# Patient Record
Sex: Male | Born: 1980 | Race: White | Hispanic: No | Marital: Single | Smoking: Current every day smoker
Health system: Southern US, Community
[De-identification: ages and names within clinical notes are randomized; demographics above are authoritative.]

## PROBLEM LIST (undated history)

## (undated) DIAGNOSIS — G8929 Other chronic pain: Secondary | ICD-10-CM

## (undated) DIAGNOSIS — F419 Anxiety disorder, unspecified: Secondary | ICD-10-CM

## (undated) DIAGNOSIS — M549 Dorsalgia, unspecified: Secondary | ICD-10-CM

## (undated) DIAGNOSIS — J45909 Unspecified asthma, uncomplicated: Secondary | ICD-10-CM

---

## 2006-05-11 ENCOUNTER — Emergency Department (HOSPITAL_COMMUNITY): Admission: EM | Admit: 2006-05-11 | Discharge: 2006-05-11 | Payer: Self-pay | Admitting: Emergency Medicine

## 2010-04-03 ENCOUNTER — Ambulatory Visit (HOSPITAL_COMMUNITY): Admission: RE | Admit: 2010-04-03 | Discharge: 2010-04-03 | Payer: Self-pay | Admitting: Internal Medicine

## 2010-12-07 IMAGING — CR DG HAND COMPLETE 3+V*R*
2 series · 2 of 2 positions shown · non-contrast
Comparison: None.

CLINICAL DATA: Hand injury.  Pain across the back of the hand.

RIGHT HAND - COMPLETE 3+ VIEW

[view not recorded (1 of 2)]
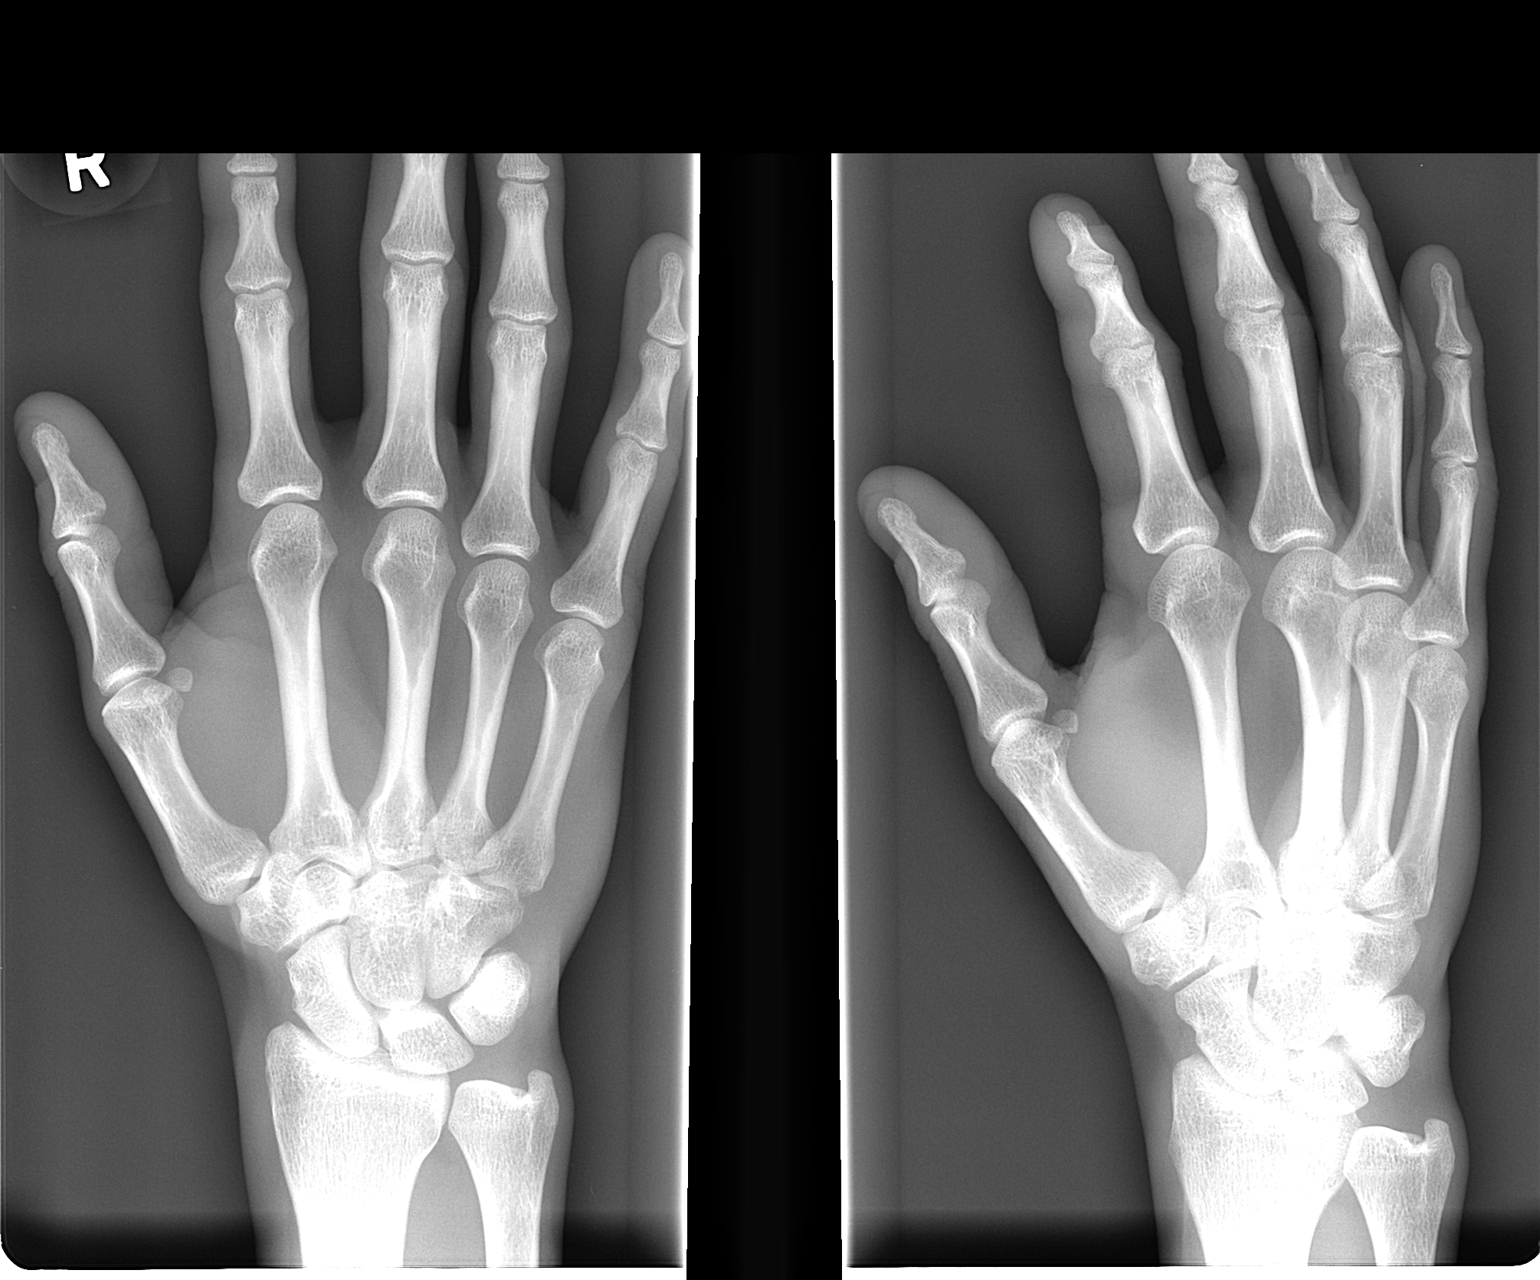

[view not recorded (2 of 2)]
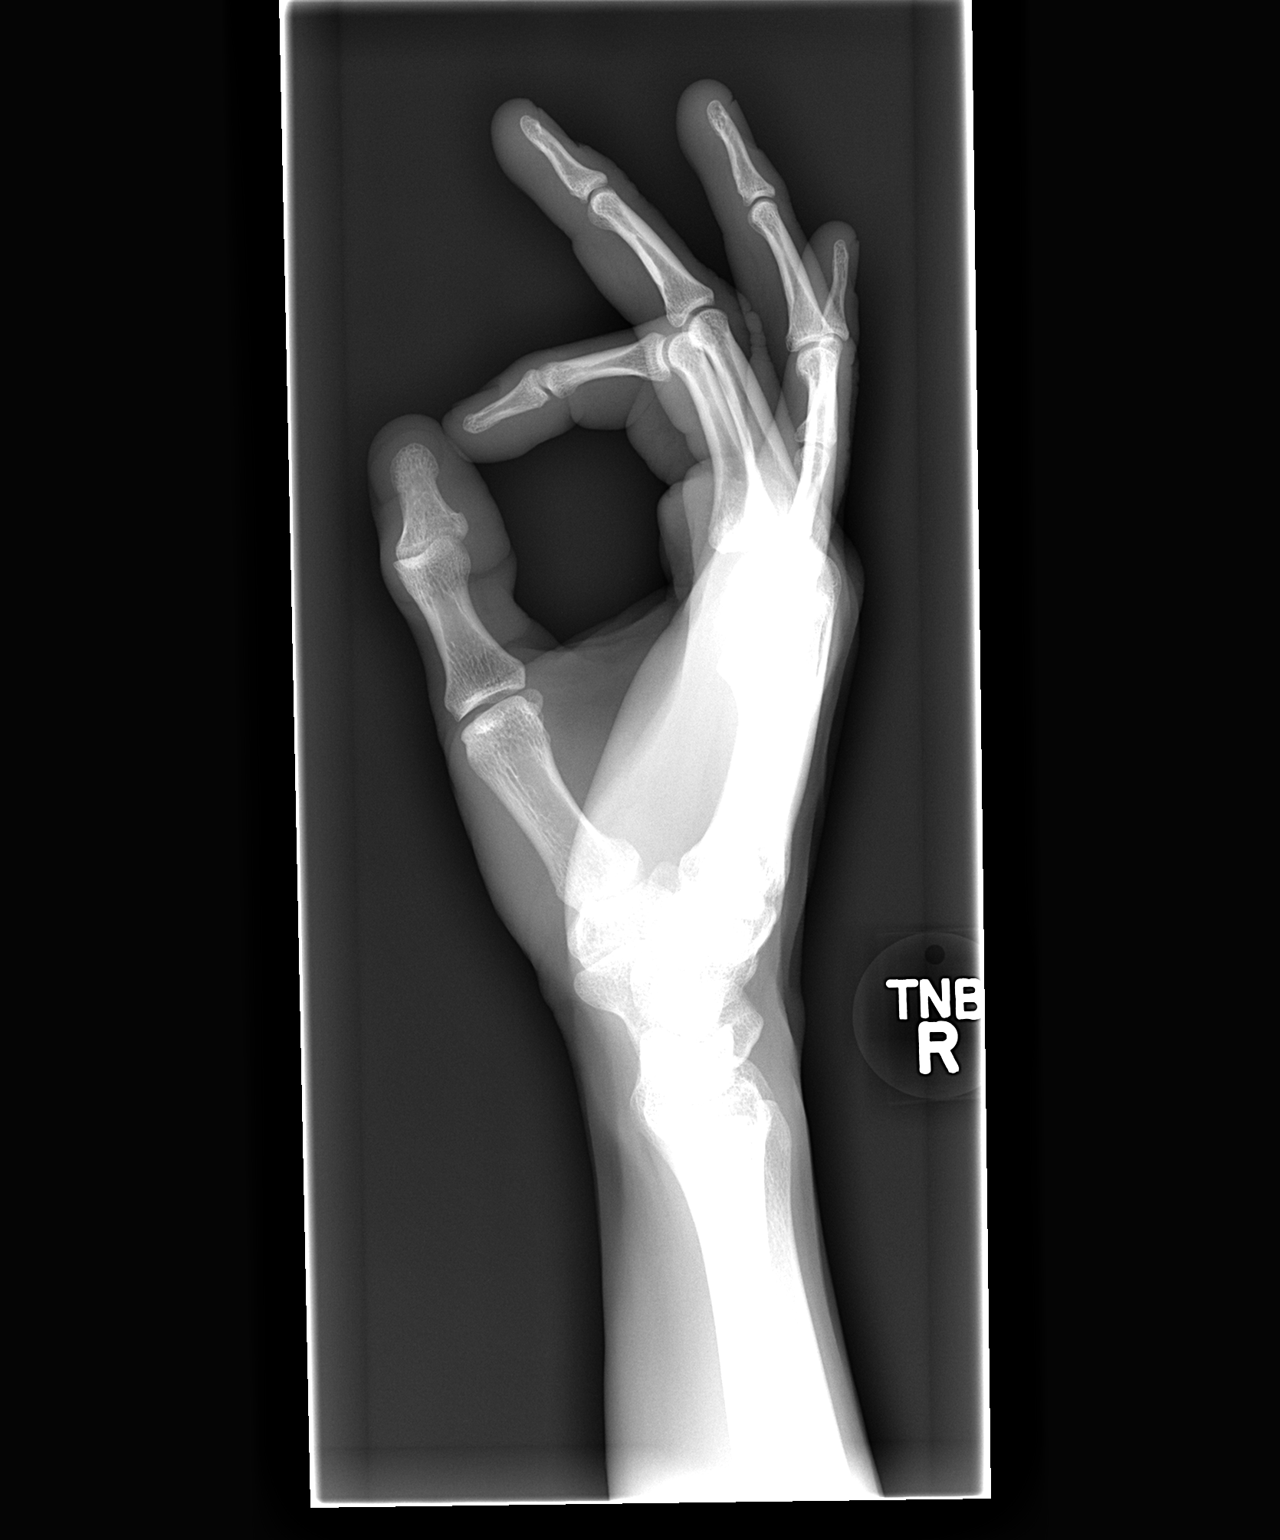

[2 of 2 positions shown; findings below may reference images not displayed]

FINDINGS: No acute bone or soft tissue abnormality is present.  No
radiopaque foreign bodies seen.
IMPRESSION: Negative right hand.

## 2011-05-29 ENCOUNTER — Ambulatory Visit: Payer: Self-pay | Admitting: Urology

## 2015-04-28 ENCOUNTER — Encounter (HOSPITAL_COMMUNITY): Payer: Self-pay

## 2015-04-28 ENCOUNTER — Emergency Department (HOSPITAL_COMMUNITY)
Admission: EM | Admit: 2015-04-28 | Discharge: 2015-04-29 | Disposition: A | Discharge status: Home / Self Care | Payer: MEDICAID | Attending: Emergency Medicine | Admitting: Emergency Medicine

## 2015-04-28 DIAGNOSIS — Z79899 Other long term (current) drug therapy: Secondary | ICD-10-CM | POA: Insufficient documentation

## 2015-04-28 DIAGNOSIS — Z720 Tobacco use: Secondary | ICD-10-CM | POA: Insufficient documentation

## 2015-04-28 DIAGNOSIS — M549 Dorsalgia, unspecified: Secondary | ICD-10-CM | POA: Insufficient documentation

## 2015-04-28 DIAGNOSIS — F329 Major depressive disorder, single episode, unspecified: Secondary | ICD-10-CM | POA: Diagnosis not present

## 2015-04-28 DIAGNOSIS — R45851 Suicidal ideations: Secondary | ICD-10-CM | POA: Diagnosis present

## 2015-04-28 DIAGNOSIS — F1010 Alcohol abuse, uncomplicated: Secondary | ICD-10-CM | POA: Insufficient documentation

## 2015-04-28 DIAGNOSIS — F1210 Cannabis abuse, uncomplicated: Secondary | ICD-10-CM | POA: Diagnosis not present

## 2015-04-28 DIAGNOSIS — F1310 Sedative, hypnotic or anxiolytic abuse, uncomplicated: Secondary | ICD-10-CM | POA: Insufficient documentation

## 2015-04-28 DIAGNOSIS — F32A Depression, unspecified: Secondary | ICD-10-CM

## 2015-04-28 DIAGNOSIS — J45909 Unspecified asthma, uncomplicated: Secondary | ICD-10-CM | POA: Diagnosis not present

## 2015-04-28 DIAGNOSIS — F419 Anxiety disorder, unspecified: Secondary | ICD-10-CM | POA: Insufficient documentation

## 2015-04-28 DIAGNOSIS — G8929 Other chronic pain: Secondary | ICD-10-CM | POA: Diagnosis not present

## 2015-04-28 HISTORY — DX: Anxiety disorder, unspecified: F41.9

## 2015-04-28 HISTORY — DX: Dorsalgia, unspecified: M54.9

## 2015-04-28 HISTORY — DX: Unspecified asthma, uncomplicated: J45.909

## 2015-04-28 HISTORY — DX: Other chronic pain: G89.29

## 2015-04-28 LAB — CBC WITH DIFFERENTIAL/PLATELET
BASOS ABS: 0 10*3/uL (ref 0.0–0.1)
BASOS PCT: 0 % (ref 0–1)
EOS ABS: 0.2 10*3/uL (ref 0.0–0.7)
EOS PCT: 3 % (ref 0–5)
HCT: 43.2 % (ref 39.0–52.0)
Hemoglobin: 14.8 g/dL (ref 13.0–17.0)
LYMPHS PCT: 35 % (ref 12–46)
Lymphs Abs: 3.4 10*3/uL (ref 0.7–4.0)
MCH: 31.6 pg (ref 26.0–34.0)
MCHC: 34.3 g/dL (ref 30.0–36.0)
MCV: 92.1 fL (ref 78.0–100.0)
Monocytes Absolute: 0.6 10*3/uL (ref 0.1–1.0)
Monocytes Relative: 6 % (ref 3–12)
Neutro Abs: 5.4 10*3/uL (ref 1.7–7.7)
Neutrophils Relative %: 56 % (ref 43–77)
PLATELETS: 293 10*3/uL (ref 150–400)
RBC: 4.69 MIL/uL (ref 4.22–5.81)
RDW: 13 % (ref 11.5–15.5)
WBC: 9.6 10*3/uL (ref 4.0–10.5)

## 2015-04-28 LAB — BASIC METABOLIC PANEL
ANION GAP: 11 (ref 5–15)
BUN: 7 mg/dL (ref 6–20)
CHLORIDE: 106 mmol/L (ref 101–111)
CO2: 24 mmol/L (ref 22–32)
Calcium: 8.8 mg/dL — ABNORMAL LOW (ref 8.9–10.3)
Creatinine, Ser: 0.81 mg/dL (ref 0.61–1.24)
GFR calc Af Amer: >60 mL/min (ref >60)
GFR calc non Af Amer: >60 mL/min (ref >60)
Glucose, Bld: 86 mg/dL (ref 65–99)
Potassium: 3.4 mmol/L — ABNORMAL LOW (ref 3.5–5.1)
SODIUM: 141 mmol/L (ref 135–145)

## 2015-04-28 LAB — RAPID URINE DRUG SCREEN, HOSP PERFORMED
AMPHETAMINES: NOT DETECTED
BENZODIAZEPINES: POSITIVE — AB
Barbiturates: NOT DETECTED
Cocaine: NOT DETECTED
OPIATES: NOT DETECTED
Tetrahydrocannabinol: POSITIVE — AB

## 2015-04-28 LAB — ETHANOL: Alcohol, Ethyl (B): 48 mg/dL — ABNORMAL HIGH (ref <5)

## 2015-04-28 MED ORDER — OXYCODONE-ACETAMINOPHEN 5-325 MG PO TABS
1.0000 | ORAL_TABLET | Freq: Once | ORAL | Status: AC
Start: 2015-04-28 — End: 2015-04-28
  Administered 2015-04-28: 1 via ORAL
  Filled 2015-04-28: qty 1

## 2015-04-28 MED ORDER — METHOCARBAMOL 500 MG PO TABS
1000.0000 mg | ORAL_TABLET | Freq: Four times a day (QID) | ORAL | Status: DC | PRN
  Administered 2015-04-28: 1000 mg via ORAL
  Filled 2015-04-28: qty 2

## 2015-04-28 MED ORDER — VITAMIN B-1 100 MG PO TABS
100.0000 mg | ORAL_TABLET | Freq: Every day | ORAL | Status: DC
  Administered 2015-04-28: 100 mg via ORAL
  Filled 2015-04-28: qty 1

## 2015-04-28 MED ORDER — THIAMINE HCL 100 MG/ML IJ SOLN: 100.0000 mg | Freq: Every day | INTRAMUSCULAR | Status: DC

## 2015-04-28 MED ORDER — LORAZEPAM 1 MG PO TABS
0.0000 mg | ORAL_TABLET | Freq: Four times a day (QID) | ORAL | Status: DC
  Administered 2015-04-29: 1 mg via ORAL
  Filled 2015-04-28: qty 1

## 2015-04-28 MED ORDER — NICOTINE 21 MG/24HR TD PT24
21.0000 mg | MEDICATED_PATCH | Freq: Every day | TRANSDERMAL | Status: DC | PRN
  Administered 2015-04-28: 21 mg via TRANSDERMAL
  Filled 2015-04-28: qty 1

## 2015-04-28 MED ORDER — ALUM & MAG HYDROXIDE-SIMETH 200-200-20 MG/5ML PO SUSP: 30.0000 mL | ORAL | Status: DC | PRN

## 2015-04-28 MED ORDER — LORAZEPAM 1 MG PO TABS: 1.0000 mg | ORAL_TABLET | Freq: Three times a day (TID) | ORAL | Status: DC | PRN

## 2015-04-28 MED ORDER — IBUPROFEN 400 MG PO TABS
400.0000 mg | ORAL_TABLET | Freq: Three times a day (TID) | ORAL | Status: DC | PRN
  Administered 2015-04-29: 400 mg via ORAL
  Filled 2015-04-28: qty 1

## 2015-04-28 MED ORDER — ONDANSETRON HCL 4 MG PO TABS: 4.0000 mg | ORAL_TABLET | Freq: Three times a day (TID) | ORAL | Status: DC | PRN

## 2015-04-28 MED ORDER — LORAZEPAM 1 MG PO TABS: 0.0000 mg | ORAL_TABLET | Freq: Two times a day (BID) | ORAL | Status: DC

## 2015-04-28 NOTE — ED Notes (Signed)
Having suicidal ideations for about a week, he has a plan to overdose per EMS. He has had 14 beers today and has been smoking marijuana. He also stated that he didn't know who he would hurt, may hurt himself and/or someone else.
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
## 2015-04-28 NOTE — ED Notes (Signed)
Has also been drinking liquor today. Did take a xanax today.

## 2015-04-28 NOTE — ED Provider Notes (Signed)
CSN: [id number]     Arrival date & time 04/28/15  2002 History   First MD Initiated Contact with Patient 04/28/15 2007     Chief Complaint  Patient presents with  . V70.1      HPI  Pt was seen at 2030. Per EMS and pt report, c/o gradual onset and worsening of persistent depression for "a long time now." Pt states for the past week he has had SI. Pt told EMS he had a plan to overdose, told me he would "shoot himself." Pt denies he has any firearms at home. Pt states he "just am sick and tired of being sick and tired" and "I can't live like this." Pt endorses "binge drinking" for the past several years, especially over the past week. States earlier today he felt "if someone got in my way I'd run them over like a truck." Denies SA, no hallucinations.    Past Medical History  Diagnosis Date  . Anxiety   . Asthma   . Chronic back pain    History reviewed. No pertinent past surgical history.  History  Substance Use Topics  . Smoking status: Current Every Day Smoker  . Smokeless tobacco: Not on file  . Alcohol Use: Yes    Review of Systems ROS: Statement: All systems negative except as marked or noted in the HPI; Constitutional: Negative for fever and chills. ; ; Eyes: Negative for eye pain, redness and discharge. ; ; ENMT: Negative for ear pain, hoarseness, nasal congestion, sinus pressure and sore throat. ; ; Cardiovascular: Negative for chest pain, palpitations, diaphoresis, dyspnea and peripheral edema. ; ; Respiratory: Negative for cough, wheezing and stridor. ; ; Gastrointestinal: Negative for nausea, vomiting, diarrhea, abdominal pain, blood in stool, hematemesis, jaundice and rectal bleeding. . ; ; Genitourinary: Negative for dysuria, flank pain and hematuria. ; ; Musculoskeletal: Negative for back pain and neck pain. Negative for swelling and trauma.; ; Skin: Negative for pruritus, rash, abrasions, blisters, bruising and skin lesion.; ; Neuro: Negative for headache, lightheadedness and  neck stiffness. Negative for weakness, altered level of consciousness , altered mental status, extremity weakness, paresthesias, involuntary movement, seizure and syncope.; Psych:  +depression, +SI. No SA, no hallucinations.     Allergies  Review of patient's allergies indicates no known allergies.  Home Medications   Prior to Admission medications   Medication Sig Start Date End Date Taking? Authorizing Provider  ALPRAZolam Prudy Feeler) 1 MG tablet Take 1 mg by mouth 4 (four) times daily as needed for anxiety.   Yes Historical Provider, MD  Multiple Vitamin (MULTIVITAMIN WITH MINERALS) TABS tablet Take 1 tablet by mouth daily.   Yes Historical Provider, MD  oxyCODONE-acetaminophen (PERCOCET) 10-325 MG per tablet Take 1 tablet by mouth 4 (four) times daily as needed for pain.   Yes Historical Provider, MD   BP 144/85 mmHg  Pulse 87  Temp(Src) 98.7 F (37.1 C) (Oral)  Resp 16  Ht 6' (1.829 m)  Wt 130 lb (58.968 kg)  BMI 17.63 kg/m2  SpO2 100% Physical Exam  2035: Physical examination:  Nursing notes reviewed; Vital signs and O2 SAT reviewed;  Constitutional: Well developed, Well nourished, Well hydrated, In no acute distress; Head:  Normocephalic, atraumatic; Eyes: EOMI, PERRL, No scleral icterus; ENMT: Mouth and pharynx normal, Mucous membranes moist; Neck: Supple, Full range of motion, No lymphadenopathy; Cardiovascular: Regular rate and rhythm, No gallop; Respiratory: Breath sounds clear & equal bilaterally, No wheezes.  Speaking full sentences with ease, Normal respiratory effort/excursion; Chest: Nontender,  Movement normal; Abdomen: Soft, Nontender, Nondistended, Normal bowel sounds; Genitourinary: No CVA tenderness; Extremities: Pulses normal, No tenderness, No edema, No calf edema or asymmetry.; Neuro: AA&Ox3, Major CN grossly intact.  Speech clear. No gross focal motor or sensory deficits in extremities.; Skin: Color normal, Warm, Dry.; Psych:  Affect flat, poor eye contact. Endorses SI.      ED Course  Procedures     EKG Interpretation None      MDM  MDM Reviewed: previous chart, nursing note and vitals Reviewed previous: labs Interpretation: labs   Results for orders placed or performed during the hospital encounter of 04/28/15  CBC with Differential/Platelet  Result Value Ref Range   WBC 9.6 4.0 - 10.5 K/uL   RBC 4.69 4.22 - 5.81 MIL/uL   Hemoglobin 14.8 13.0 - 17.0 g/dL   HCT 97.4 16.3 - 84.5 %   MCV 92.1 78.0 - 100.0 fL   MCH 31.6 26.0 - 34.0 pg   MCHC 34.3 30.0 - 36.0 g/dL   RDW 36.4 68.0 - 32.1 %   Platelets 293 150 - 400 K/uL   Neutrophils Relative % 56 43 - 77 %   Neutro Abs 5.4 1.7 - 7.7 K/uL   Lymphocytes Relative 35 12 - 46 %   Lymphs Abs 3.4 0.7 - 4.0 K/uL   Monocytes Relative 6 3 - 12 %   Monocytes Absolute 0.6 0.1 - 1.0 K/uL   Eosinophils Relative 3 0 - 5 %   Eosinophils Absolute 0.2 0.0 - 0.7 K/uL   Basophils Relative 0 0 - 1 %   Basophils Absolute 0.0 0.0 - 0.1 K/uL  Basic metabolic panel  Result Value Ref Range   Sodium 141 135 - 145 mmol/L   Potassium 3.4 (L) 3.5 - 5.1 mmol/L   Chloride 106 101 - 111 mmol/L   CO2 24 22 - 32 mmol/L   Glucose, Bld 86 65 - 99 mg/dL   BUN 7 6 - 20 mg/dL   Creatinine, Ser 2.24 0.61 - 1.24 mg/dL   Calcium 8.8 (L) 8.9 - 10.3 mg/dL   GFR calc non Af Amer >60 >60 mL/min   GFR calc Af Amer >60 >60 mL/min   Anion gap 11 5 - 15  Ethanol  Result Value Ref Range   Alcohol, Ethyl (B) 48 (H) <5 mg/dL  Urine rapid drug screen (hosp performed)  Result Value Ref Range   Opiates NONE DETECTED NONE DETECTED   Cocaine NONE DETECTED NONE DETECTED   Benzodiazepines POSITIVE (A) NONE DETECTED   Amphetamines NONE DETECTED NONE DETECTED   Tetrahydrocannabinol POSITIVE (A) NONE DETECTED   Barbiturates NONE DETECTED NONE DETECTED    2115:  TTS eval pending. Holding orders written.        [name], DO 04/28/15 2134

## 2015-04-29 ENCOUNTER — Encounter (HOSPITAL_COMMUNITY): Payer: Self-pay

## 2015-04-29 ENCOUNTER — Inpatient Hospital Stay (HOSPITAL_COMMUNITY)
Admission: EM | Admit: 2015-04-29 | Discharge: 2015-05-02 | DRG: 897 | Disposition: A | Discharge status: Home / Self Care | Payer: MEDICAID | Source: Intra-hospital | Attending: Psychiatry | Admitting: Psychiatry

## 2015-04-29 DIAGNOSIS — F411 Generalized anxiety disorder: Secondary | ICD-10-CM | POA: Diagnosis present

## 2015-04-29 DIAGNOSIS — Y902 Blood alcohol level of 40-59 mg/100 ml: Secondary | ICD-10-CM | POA: Diagnosis present

## 2015-04-29 DIAGNOSIS — F1020 Alcohol dependence, uncomplicated: Secondary | ICD-10-CM | POA: Diagnosis present

## 2015-04-29 DIAGNOSIS — F4010 Social phobia, unspecified: Secondary | ICD-10-CM | POA: Diagnosis present

## 2015-04-29 DIAGNOSIS — F329 Major depressive disorder, single episode, unspecified: Secondary | ICD-10-CM | POA: Diagnosis not present

## 2015-04-29 DIAGNOSIS — R45851 Suicidal ideations: Secondary | ICD-10-CM | POA: Diagnosis present

## 2015-04-29 DIAGNOSIS — G4700 Insomnia, unspecified: Secondary | ICD-10-CM | POA: Diagnosis present

## 2015-04-29 DIAGNOSIS — F17210 Nicotine dependence, cigarettes, uncomplicated: Secondary | ICD-10-CM | POA: Diagnosis present

## 2015-04-29 DIAGNOSIS — F322 Major depressive disorder, single episode, severe without psychotic features: Secondary | ICD-10-CM | POA: Diagnosis present

## 2015-04-29 DIAGNOSIS — F410 Panic disorder [episodic paroxysmal anxiety] without agoraphobia: Secondary | ICD-10-CM | POA: Diagnosis present

## 2015-04-29 DIAGNOSIS — F1210 Cannabis abuse, uncomplicated: Secondary | ICD-10-CM | POA: Diagnosis present

## 2015-04-29 DIAGNOSIS — F1320 Sedative, hypnotic or anxiolytic dependence, uncomplicated: Secondary | ICD-10-CM | POA: Diagnosis present

## 2015-04-29 LAB — COMPREHENSIVE METABOLIC PANEL
ALBUMIN: 4.7 g/dL (ref 3.5–5.0)
ALK PHOS: 82 U/L (ref 38–126)
ALT: 21 U/L (ref 17–63)
AST: 25 U/L (ref 15–41)
Anion gap: 9 (ref 5–15)
BILIRUBIN TOTAL: 0.4 mg/dL (ref 0.3–1.2)
BUN: 14 mg/dL (ref 6–20)
CHLORIDE: 103 mmol/L (ref 101–111)
CO2: 29 mmol/L (ref 22–32)
Calcium: 9.6 mg/dL (ref 8.9–10.3)
Creatinine, Ser: 0.99 mg/dL (ref 0.61–1.24)
GFR calc Af Amer: >60 mL/min (ref >60)
GFR calc non Af Amer: >60 mL/min (ref >60)
Glucose, Bld: 80 mg/dL (ref 65–99)
Potassium: 4 mmol/L (ref 3.5–5.1)
Sodium: 141 mmol/L (ref 135–145)
Total Protein: 7.7 g/dL (ref 6.5–8.1)

## 2015-04-29 MED ORDER — ENSURE ENLIVE PO LIQD
237.0000 mL | Freq: Two times a day (BID) | ORAL | Status: DC
  Administered 2015-04-29 – 2015-05-01 (×5): 237 mL via ORAL

## 2015-04-29 MED ORDER — THIAMINE HCL 100 MG/ML IJ SOLN: 100.0000 mg | Freq: Once | INTRAMUSCULAR | Status: DC

## 2015-04-29 MED ORDER — CHLORDIAZEPOXIDE HCL 25 MG PO CAPS
25.0000 mg | ORAL_CAPSULE | Freq: Four times a day (QID) | ORAL | Status: AC | PRN
  Administered 2015-04-29: 25 mg via ORAL
  Filled 2015-04-29: qty 1

## 2015-04-29 MED ORDER — LOPERAMIDE HCL 2 MG PO CAPS: 2.0000 mg | ORAL_CAPSULE | ORAL | Status: AC | PRN

## 2015-04-29 MED ORDER — VITAMIN B-1 100 MG PO TABS
100.0000 mg | ORAL_TABLET | Freq: Every day | ORAL | Status: DC
  Administered 2015-04-30 – 2015-05-02 (×3): 100 mg via ORAL
  Filled 2015-04-29 (×5): qty 1

## 2015-04-29 MED ORDER — CHLORDIAZEPOXIDE HCL 25 MG PO CAPS
25.0000 mg | ORAL_CAPSULE | Freq: Three times a day (TID) | ORAL | Status: AC
  Administered 2015-04-30 – 2015-05-01 (×3): 25 mg via ORAL
  Filled 2015-04-29 (×3): qty 1

## 2015-04-29 MED ORDER — CHLORDIAZEPOXIDE HCL 25 MG PO CAPS
25.0000 mg | ORAL_CAPSULE | ORAL | Status: AC
  Administered 2015-05-01 – 2015-05-02 (×2): 25 mg via ORAL
  Filled 2015-04-29 (×2): qty 1

## 2015-04-29 MED ORDER — NICOTINE 21 MG/24HR TD PT24
21.0000 mg | MEDICATED_PATCH | Freq: Every day | TRANSDERMAL | Status: DC
  Administered 2015-04-29 – 2015-05-02 (×4): 21 mg via TRANSDERMAL
  Filled 2015-04-29 (×6): qty 1
  Filled 2015-04-29: qty 14
  Filled 2015-04-29: qty 1

## 2015-04-29 MED ORDER — GABAPENTIN 300 MG PO CAPS
300.0000 mg | ORAL_CAPSULE | Freq: Every day | ORAL | Status: DC
  Administered 2015-04-29 – 2015-05-01 (×3): 300 mg via ORAL
  Filled 2015-04-29 (×5): qty 1

## 2015-04-29 MED ORDER — CHLORDIAZEPOXIDE HCL 25 MG PO CAPS: 25.0000 mg | ORAL_CAPSULE | Freq: Every day | ORAL | Status: DC

## 2015-04-29 MED ORDER — HYDROXYZINE HCL 25 MG PO TABS
25.0000 mg | ORAL_TABLET | Freq: Four times a day (QID) | ORAL | Status: AC | PRN
  Administered 2015-04-29 – 2015-05-01 (×4): 25 mg via ORAL
  Filled 2015-04-29 (×4): qty 1

## 2015-04-29 MED ORDER — GABAPENTIN 300 MG PO CAPS
300.0000 mg | ORAL_CAPSULE | Freq: Three times a day (TID) | ORAL | Status: DC
  Administered 2015-04-29 – 2015-05-02 (×10): 300 mg via ORAL
  Filled 2015-04-29 (×6): qty 1
  Filled 2015-04-29: qty 56
  Filled 2015-04-29 (×7): qty 1
  Filled 2015-04-29: qty 56
  Filled 2015-04-29 (×2): qty 1
  Filled 2015-04-29: qty 56

## 2015-04-29 MED ORDER — ADULT MULTIVITAMIN W/MINERALS CH
1.0000 | ORAL_TABLET | Freq: Every day | ORAL | Status: DC
  Administered 2015-04-29 – 2015-05-02 (×4): 1 via ORAL
  Filled 2015-04-29 (×3): qty 1
  Filled 2015-04-29: qty 14
  Filled 2015-04-29 (×4): qty 1

## 2015-04-29 MED ORDER — MAGNESIUM HYDROXIDE 400 MG/5ML PO SUSP: 30.0000 mL | Freq: Every day | ORAL | Status: DC | PRN

## 2015-04-29 MED ORDER — ACETAMINOPHEN 325 MG PO TABS
650.0000 mg | ORAL_TABLET | Freq: Four times a day (QID) | ORAL | Status: DC | PRN
  Administered 2015-04-29 – 2015-05-02 (×5): 650 mg via ORAL
  Filled 2015-04-29 (×5): qty 2

## 2015-04-29 MED ORDER — ONDANSETRON 4 MG PO TBDP: 4.0000 mg | ORAL_TABLET | Freq: Four times a day (QID) | ORAL | Status: AC | PRN

## 2015-04-29 MED ORDER — CHLORDIAZEPOXIDE HCL 25 MG PO CAPS
25.0000 mg | ORAL_CAPSULE | Freq: Four times a day (QID) | ORAL | Status: AC
  Administered 2015-04-29 – 2015-04-30 (×6): 25 mg via ORAL
  Filled 2015-04-29 (×6): qty 1

## 2015-04-29 MED ORDER — ALUM & MAG HYDROXIDE-SIMETH 200-200-20 MG/5ML PO SUSP: 30.0000 mL | ORAL | Status: DC | PRN

## 2015-04-29 NOTE — Progress Notes (Signed)
NUTRITION ASSESSMENT  Pt identified as at risk on the Malnutrition Screen Tool  INTERVENTION: 1. Educated patient on the importance of nutrition and encouraged intake of food and beverages. 2. Discussed weight goals. 3. Supplements: Ensure Enlive po BID, each supplement provides 350 kcal and 20 grams of protein  NUTRITION DIAGNOSIS: Unintentional weight loss related to sub-optimal intake as evidenced by pt report.   Goal: Pt to meet >/= 90% of their estimated nutrition needs.  Monitor:  PO intake  Assessment:  Pt seen for MST. Pt was eating breakfast at time of visit and ate 25-50% at that time. He states that PTA he was eating breakfast and a big dinner and would drink during the day. 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 Encouraged adequate hydration with water and Gatorade while at work and pt reports that he and his coworkers ensure that they do this.  Ensure Enlive order already placed. Likely not fully meeting needs at this time.  34 y.o. male  Height: Ht Readings from Last 1 Encounters:  04/29/15 6' (1.829 m)    Weight: Wt Readings from Last 1 Encounters:  04/29/15 129 lb (58.514 kg)    Weight Hx: Wt Readings from Last 10 Encounters:  04/29/15 129 lb (58.514 kg)  04/28/15 130 lb (58.968 kg)    BMI:  Body mass index is 17.49 kg/(m^2). Pt meets criteria for underweight based on current BMI.  Estimated Nutritional Needs: Kcal: 25-30 kcal/kg Protein: > 1 gram protein/kg Fluid: 1 ml/kcal  Diet Order: Diet regular Room service appropriate?: Yes; Fluid consistency:: Thin Pt is also offered choice of unit snacks mid-morning and mid-afternoon.  Pt is eating as desired.   Lab results and medications reviewed.    Trenton Gammon, RD, LDN Inpatient Clinical Dietitian Pager # [phone] After hours/weekend pager # [phone]

## 2015-04-29 NOTE — Progress Notes (Signed)
Pt attended AA meeting.  

## 2015-04-29 NOTE — Progress Notes (Signed)
Recreation Therapy Notes  Date: 06.17.16 Time: 09:30 am Location: [address] Group Room  Group Topic: Stress Management  Goal Area(s) Addresses:  Patient will verbalize importance of using healthy stress management.  Patient will identify positive emotions associated with healthy stress management.   Intervention: Stress Management  Activity :  Progressive Muscle Relaxation.  LRT introduced and educated patients on stress management technique of progressive muscle relaxation.  A script was used to deliver the technique to patients.  Patients were asked to follow script read a loud by LRT to engage in the stress management technique.  Education:  Stress Management, Discharge Planning.   Clinical Observations/Feedback: Patient did not attend group.    Caroll Rancher, LRT/CTRS         Lillia Abed, Sugey Trevathan A 04/29/2015 1:18 PM

## 2015-04-29 NOTE — H&P (Signed)
Psychiatric Admission Assessment Adult  Patient Identification: Jay Nelson MRN:  [id number] Date of Evaluation:  04/29/2015 Chief Complaint:  Alcohol Use Disorder Principal Diagnosis: Major depressive disorder, single episode, severe Diagnosis:   Patient Active Problem List   Diagnosis Date Noted  . Major depressive disorder, single episode, severe [F32.2] 04/29/2015  . Alcohol use disorder, severe, dependence [F10.20] 04/29/2015  . Cannabis abuse [F12.10] 04/29/2015   History of Present Illness:: 34 Y/o male who states he has a problem with alcohol, drinks as much as he can. ( last episode14 beers in 4 and 1/2 hrs at 3:30 in the afternoon yesterday) has bee smoking pot since he was 15. States he has been dealing with anxiety all of his life not comfortable on his own skin. States the anxiety builds up to a full panic attack. "Can't breath good, chest feels tight". He takes Xanax up to 1 mg QID. Has been on a "drunk for 7 days" so he has not been back to work so he does not know if he has a job or not. He also admits to depression on going  Elements:  Location:  underlying anxiety disorder with alcohol dependence increasingly more overwhlmed with some depression. Quality:  unable to function unble to stop drinking what has maintained his depression gonig on increased tolerance to teh Xanax with worsening of the anxiety. Severity:  severe. Timing:  every day. Duration:  building up but worst in the last 7 days. Context:  underlying anxiety disorder partially treated with benzodiazepine with alcohol dependence on a bing for 7 days unble to stop gettign more depressed could have lost his job. Associated Signs/Symptoms: Depression Symptoms:  depressed mood, anhedonia, insomnia, fatigue, feelings of worthlessness/guilt, difficulty concentrating, suicidal thoughts without plan, anxiety, panic attacks, loss of energy/fatigue, disturbed sleep, weight loss, 15 pounds in 2 months (Hypo)  Manic Symptoms:  Impulsivity, Labiality of Mood, when he drinks Anxiety Symptoms:  Excessive Worry, Panic Symptoms, Social Anxiety, Psychotic Symptoms:  denies PTSD Symptoms: denies Total Time spent with patient: 45 minutes  Past Medical History:  Past Medical History  Diagnosis Date  . Anxiety   . Asthma   . Chronic back pain    History reviewed. No pertinent past surgical history. Family History: History reviewed. No pertinent family history.  Father alcohol drugs grandfather alcoholic mother stays depressed  Social History:  History  Alcohol Use  . Yes     History  Drug Use  . Yes  . Special: Marijuana    History   Social History  . Marital Status: Single    Spouse Name: N/A  . Number of Children: N/A  . Years of Education: N/A   Social History Main Topics  . Smoking status: Current Every Day Smoker  . Smokeless tobacco: Not on file  . Alcohol Use: Yes  . Drug Use: Yes    Special: Marijuana  . Sexual Activity: Not on file   Other Topics Concern  . None   Social History Narrative  Lives with his 83 Y/O son.  States has been raising him by himself. Four years college motor sports ( ended due to drug test).  Additional Social History:    Pain Medications: Percocet for back pain.  Percocet 51m up to 4x/D Prescriptions: Xanax 150mup to 4x/D Over the Counter: None History of alcohol / drug use?: Yes Longest period of sobriety (when/how long): 120 days Negative Consequences of Use: Legal Withdrawal Symptoms: Agitation, Irritability, Tremors Name of Substance 1: ETOH.  Pt will binge for 7-14 days and then be off it for 2-3 weeks. 1 - Age of First Use: 34 years of age 23 - Amount (size/oz): Will drink over 14 beers at a time. 1 - Frequency: Per day when on a binge of 1-2 weeks. 1 - Duration: Off an on when binging 1 - Last Use / Amount: 06/16 Drank 14 beers. Name of Substance 2: Marijuana 2 - Age of First Use: 34 years of age 14 - Amount (size/oz): 2-3  joints daily 2 - Frequency: Daily use 2 - Duration: On-going 2 - Last Use / Amount: 06/15                 Musculoskeletal: Strength & Muscle Tone: within normal limits Gait & Station: normal Patient leans: normal  Psychiatric Specialty Exam: Physical Exam  Review of Systems  Constitutional: Positive for weight loss.  Eyes: Negative.   Respiratory:       Pack a day  Cardiovascular: Negative.   Gastrointestinal: Negative.   Genitourinary: Negative.   Musculoskeletal: Positive for back pain, joint pain and neck pain.  Skin: Negative.   Neurological: Positive for dizziness.  Endo/Heme/Allergies: Negative.   Psychiatric/Behavioral: Positive for depression and substance abuse. The patient is nervous/anxious and has insomnia.     Blood pressure 122/77, pulse 59, temperature 98.5 F (36.9 C), temperature source Oral, resp. rate 15, height 6' (1.829 m), weight 58.514 kg (129 lb).Body mass index is 17.49 kg/(m^2).  General Appearance: Fairly Groomed  Engineer, water::  Fair  Speech:  Clear and Coherent  Volume:  Normal  Mood:  Anxious, Depressed and worried  Affect:  Depressed and anxious worried  Thought Process:  Coherent and Goal Directed  Orientation:  Full (Time, Place, and Person)  Thought Content:  symptoms events worries concerns  Suicidal Thoughts:  No  Homicidal Thoughts:  No  Memory:  Immediate;   Fair Recent;   Fair Remote;   Fair  Judgement:  Fair  Insight:  Present and Shallow  Psychomotor Activity:  Restlessness  Concentration:  Fair  Recall:  [employer] of Knowledge:Fair  Language: Fair  Akathisia:  No  Handed:  Right  AIMS (if indicated):     Assets:  Desire for Improvement Housing Social Support  ADL's:  Intact  Cognition: WNL  Sleep:      Risk to Self: Is patient at risk for suicide?: No Risk to Others:   Prior Inpatient Therapy:  Denies Prior Outpatient Therapy:  Denies PCP gives him Xanax  Alcohol Screening: 1. How often do you have a  drink containing alcohol?: 4 or more times a week 2. How many drinks containing alcohol do you have on a typical day when you are drinking?: 10 or more 3. How often do you have six or more drinks on one occasion?: Daily or almost daily Preliminary Score: 8 4. How often during the last year have you found that you were not able to stop drinking once you had started?: Daily or almost daily 5. How often during the last year have you failed to do what was normally expected from you becasue of drinking?: Less than monthly 6. How often during the last year have you needed a first drink in the morning to get yourself going after a heavy drinking session?: Less than monthly 7. How often during the last year have you had a feeling of guilt of remorse after drinking?: Weekly 8. How often during the last year have you been unable  to remember what happened the night before because you had been drinking?: Never 9. Have you or someone else been injured as a result of your drinking?: No 10. Has a relative or friend or a doctor or another health worker been concerned about your drinking or suggested you cut down?: Yes, during the last year Alcohol Use Disorder Identification Test Final Score (AUDIT): 25 Brief Intervention: Yes  Allergies:  No Known Allergies Lab Results:  Results for orders placed or performed during the hospital encounter of 04/28/15 (from the past 48 hour(s))  Basic metabolic panel     Status: Abnormal   Collection Time: 04/28/15  8:15 PM  Result Value Ref Range   Sodium 141 135 - 145 mmol/L   Potassium 3.4 (L) 3.5 - 5.1 mmol/L   Chloride 106 101 - 111 mmol/L   CO2 24 22 - 32 mmol/L   Glucose, Bld 86 65 - 99 mg/dL   BUN 7 6 - 20 mg/dL   Creatinine, Ser 0.81 0.61 - 1.24 mg/dL   Calcium 8.8 (L) 8.9 - 10.3 mg/dL   GFR calc non Af Amer >60 >60 mL/min   GFR calc Af Amer >60 >60 mL/min    Comment: (NOTE) The eGFR has been calculated using the CKD EPI equation. This calculation has not  been validated in all clinical situations. eGFR's persistently <60 mL/min signify possible Chronic Kidney Disease.    Anion gap 11 5 - 15  Ethanol     Status: Abnormal   Collection Time: 04/28/15  8:15 PM  Result Value Ref Range   Alcohol, Ethyl (B) 48 (H) <5 mg/dL    Comment:        LOWEST DETECTABLE LIMIT FOR SERUM ALCOHOL IS 5 mg/dL FOR MEDICAL PURPOSES ONLY   CBC with Differential/Platelet     Status: None   Collection Time: 04/28/15  8:19 PM  Result Value Ref Range   WBC 9.6 4.0 - 10.5 K/uL   RBC 4.69 4.22 - 5.81 MIL/uL   Hemoglobin 14.8 13.0 - 17.0 g/dL   HCT 43.2 39.0 - 52.0 %   MCV 92.1 78.0 - 100.0 fL   MCH 31.6 26.0 - 34.0 pg   MCHC 34.3 30.0 - 36.0 g/dL   RDW 13.0 11.5 - 15.5 %   Platelets 293 150 - 400 K/uL   Neutrophils Relative % 56 43 - 77 %   Neutro Abs 5.4 1.7 - 7.7 K/uL   Lymphocytes Relative 35 12 - 46 %   Lymphs Abs 3.4 0.7 - 4.0 K/uL   Monocytes Relative 6 3 - 12 %   Monocytes Absolute 0.6 0.1 - 1.0 K/uL   Eosinophils Relative 3 0 - 5 %   Eosinophils Absolute 0.2 0.0 - 0.7 K/uL   Basophils Relative 0 0 - 1 %   Basophils Absolute 0.0 0.0 - 0.1 K/uL  Urine rapid drug screen (hosp performed)     Status: Abnormal   Collection Time: 04/28/15  8:40 PM  Result Value Ref Range   Opiates NONE DETECTED NONE DETECTED   Cocaine NONE DETECTED NONE DETECTED   Benzodiazepines POSITIVE (A) NONE DETECTED   Amphetamines NONE DETECTED NONE DETECTED   Tetrahydrocannabinol POSITIVE (A) NONE DETECTED   Barbiturates NONE DETECTED NONE DETECTED    Comment:        DRUG SCREEN FOR MEDICAL PURPOSES ONLY.  IF CONFIRMATION IS NEEDED FOR ANY PURPOSE, NOTIFY LAB WITHIN 5 DAYS.        LOWEST DETECTABLE LIMITS FOR URINE DRUG  SCREEN Drug Class       Cutoff (ng/mL) Amphetamine      1000 Barbiturate      200 Benzodiazepine   341 Tricyclics       937 Opiates          300 Cocaine          300 THC              50    Current Medications: Current Facility-Administered  Medications  Medication Dose Route Frequency Provider Last Rate Last Dose  . acetaminophen (TYLENOL) tablet 650 mg  650 mg Oral Q6H PRN Harriet Butte, NP      . alum & mag hydroxide-simeth (MAALOX/MYLANTA) 200-200-20 MG/5ML suspension 30 mL  30 mL Oral Q4H PRN Harriet Butte, NP      . chlordiazePOXIDE (LIBRIUM) capsule 25 mg  25 mg Oral Q6H PRN Harriet Butte, NP   25 mg at 04/29/15 0355  . chlordiazePOXIDE (LIBRIUM) capsule 25 mg  25 mg Oral QID Harriet Butte, NP   25 mg at 04/29/15 0758   Followed by  . [START ON 04/30/2015] chlordiazePOXIDE (LIBRIUM) capsule 25 mg  25 mg Oral TID Harriet Butte, NP       Followed by  . [START ON 05/01/2015] chlordiazePOXIDE (LIBRIUM) capsule 25 mg  25 mg Oral BH-qamhs Harriet Butte, NP       Followed by  . [START ON 05/03/2015] chlordiazePOXIDE (LIBRIUM) capsule 25 mg  25 mg Oral Daily Harriet Butte, NP      . feeding supplement (ENSURE ENLIVE) (ENSURE ENLIVE) liquid 237 mL  237 mL Oral BID BM Nicholaus Bloom, MD      . hydrOXYzine (ATARAX/VISTARIL) tablet 25 mg  25 mg Oral Q6H PRN Harriet Butte, NP   25 mg at 04/29/15 0355  . loperamide (IMODIUM) capsule 2-4 mg  2-4 mg Oral PRN Harriet Butte, NP      . magnesium hydroxide (MILK OF MAGNESIA) suspension 30 mL  30 mL Oral Daily PRN Harriet Butte, NP      . multivitamin with minerals tablet 1 tablet  1 tablet Oral Daily Harriet Butte, NP   1 tablet at 04/29/15 0758  . nicotine (NICODERM CQ - dosed in mg/24 hours) patch 21 mg  21 mg Transdermal Daily Nicholaus Bloom, MD   21 mg at 04/29/15 0758  . ondansetron (ZOFRAN-ODT) disintegrating tablet 4 mg  4 mg Oral Q6H PRN Harriet Butte, NP      . thiamine (B-1) injection 100 mg  100 mg Intramuscular Once Harriet Butte, NP   100 mg at 04/29/15 0358  . [START ON 04/30/2015] thiamine (VITAMIN B-1) tablet 100 mg  100 mg Oral Daily Harriet Butte, NP       PTA Medications: Prescriptions prior to admission  Medication Sig Dispense Refill Last Dose  .  ALPRAZolam (XANAX) 1 MG tablet Take 1 mg by mouth 4 (four) times daily as needed for anxiety.   04/28/2015 at Unknown time  . Multiple Vitamin (MULTIVITAMIN WITH MINERALS) TABS tablet Take 1 tablet by mouth daily.   Past Week at Unknown time  . oxyCODONE-acetaminophen (PERCOCET) 10-325 MG per tablet Take 1 tablet by mouth 4 (four) times daily as needed for pain.   04/27/2015 at Unknown time    Previous Psychotropic Medications: Yes Xanax by PCP, Prozac ( 15 years ago did not work)   Substance Abuse History in the last  12 months:  Yes.      Consequences of Substance Abuse: Legal Consequences:  one DWI Blackouts:   Withdrawal Symptoms:   Tremors uses Xanax every day so it curbs the withdrawal  Results for orders placed or performed during the hospital encounter of 04/28/15 (from the past 72 hour(s))  Basic metabolic panel     Status: Abnormal   Collection Time: 04/28/15  8:15 PM  Result Value Ref Range   Sodium 141 135 - 145 mmol/L   Potassium 3.4 (L) 3.5 - 5.1 mmol/L   Chloride 106 101 - 111 mmol/L   CO2 24 22 - 32 mmol/L   Glucose, Bld 86 65 - 99 mg/dL   BUN 7 6 - 20 mg/dL   Creatinine, Ser 0.81 0.61 - 1.24 mg/dL   Calcium 8.8 (L) 8.9 - 10.3 mg/dL   GFR calc non Af Amer >60 >60 mL/min   GFR calc Af Amer >60 >60 mL/min    Comment: (NOTE) The eGFR has been calculated using the CKD EPI equation. This calculation has not been validated in all clinical situations. eGFR's persistently <60 mL/min signify possible Chronic Kidney Disease.    Anion gap 11 5 - 15  Ethanol     Status: Abnormal   Collection Time: 04/28/15  8:15 PM  Result Value Ref Range   Alcohol, Ethyl (B) 48 (H) <5 mg/dL    Comment:        LOWEST DETECTABLE LIMIT FOR SERUM ALCOHOL IS 5 mg/dL FOR MEDICAL PURPOSES ONLY   CBC with Differential/Platelet     Status: None   Collection Time: 04/28/15  8:19 PM  Result Value Ref Range   WBC 9.6 4.0 - 10.5 K/uL   RBC 4.69 4.22 - 5.81 MIL/uL   Hemoglobin 14.8 13.0 - 17.0  g/dL   HCT 43.2 39.0 - 52.0 %   MCV 92.1 78.0 - 100.0 fL   MCH 31.6 26.0 - 34.0 pg   MCHC 34.3 30.0 - 36.0 g/dL   RDW 13.0 11.5 - 15.5 %   Platelets 293 150 - 400 K/uL   Neutrophils Relative % 56 43 - 77 %   Neutro Abs 5.4 1.7 - 7.7 K/uL   Lymphocytes Relative 35 12 - 46 %   Lymphs Abs 3.4 0.7 - 4.0 K/uL   Monocytes Relative 6 3 - 12 %   Monocytes Absolute 0.6 0.1 - 1.0 K/uL   Eosinophils Relative 3 0 - 5 %   Eosinophils Absolute 0.2 0.0 - 0.7 K/uL   Basophils Relative 0 0 - 1 %   Basophils Absolute 0.0 0.0 - 0.1 K/uL  Urine rapid drug screen (hosp performed)     Status: Abnormal   Collection Time: 04/28/15  8:40 PM  Result Value Ref Range   Opiates NONE DETECTED NONE DETECTED   Cocaine NONE DETECTED NONE DETECTED   Benzodiazepines POSITIVE (A) NONE DETECTED   Amphetamines NONE DETECTED NONE DETECTED   Tetrahydrocannabinol POSITIVE (A) NONE DETECTED   Barbiturates NONE DETECTED NONE DETECTED    Comment:        DRUG SCREEN FOR MEDICAL PURPOSES ONLY.  IF CONFIRMATION IS NEEDED FOR ANY PURPOSE, NOTIFY LAB WITHIN 5 DAYS.        LOWEST DETECTABLE LIMITS FOR URINE DRUG SCREEN Drug Class       Cutoff (ng/mL) Amphetamine      1000 Barbiturate      200 Benzodiazepine   347 Tricyclics       425 Opiates  300 Cocaine          300 THC              50     Observation Level/Precautions:  15 minute checks  Laboratory:  As per the ED and CMET  Psychotherapy:  Individual/group  Medications:  Libirum detox protocol  Consultations:    Discharge Concerns:    Estimated LOS:  Other:     Psychological Evaluations: No   Treatment Plan Summary: Daily contact with patient to assess and evaluate symptoms and progress in treatment and Medication management Supportive approach/copign skills Alcohol dependence: Librium detox protocol/work a relapse prevention plan Anxiety; reassess for the use of an SSRI/CBT, mindfulness Benzodiazepine dependence; Librium detox protocol will add  Neurontin for safety reasons Depression; will use and SSRI to address the anxiety and the depression Consider a residential treatment program Medical Decision Making:  Review of Psycho-Social Stressors (1), Review or order clinical lab tests (1) and Review of Medication Regimen & Side Effects (2)  I certify that inpatient services furnished can reasonably be expected to improve the patient's condition.   Aloni Chuang A 6/17/20169:44 AM

## 2015-04-29 NOTE — BHH Suicide Risk Assessment (Signed)
[facility] Admission Suicide Risk Assessment   Nursing information obtained from:    Demographic factors:    Current Mental Status:    Loss Factors:    Historical Factors:    Risk Reduction Factors:    Total Time spent with patient: 45 minutes Principal Problem: Alcohol use disorder, severe, dependence Diagnosis:   Patient Active Problem List   Diagnosis Date Noted  . Major depressive disorder, single episode, severe [F32.2] 04/29/2015  . Alcohol use disorder, severe, dependence [F10.20] 04/29/2015  . Cannabis abuse [F12.10] 04/29/2015  . GAD (generalized anxiety disorder) [F41.1] 04/29/2015  . Social anxiety disorder [F40.10] 04/29/2015  . Benzodiazepine dependence, continuous [F13.20] 04/29/2015     Continued Clinical Symptoms:  Alcohol Use Disorder Identification Test Final Score (AUDIT): 25 The "Alcohol Use Disorders Identification Test", Guidelines for Use in Primary Care, Second Edition.  World Science writer [facility]). Score between 0-7:  no or low risk or alcohol related problems. Score between 8-15:  moderate risk of alcohol related problems. Score between 16-19:  high risk of alcohol related problems. Score 20 or above:  warrants further diagnostic evaluation for alcohol dependence and treatment.   CLINICAL FACTORS:   Severe Anxiety and/or Agitation Depression:   Comorbid alcohol abuse/dependence Impulsivity Alcohol/Substance Abuse/Dependencies   Musculoskeletal:  Psychiatric Specialty Exam: Physical Exam  ROS  Blood pressure 117/86, pulse 98, temperature 98.5 F (36.9 C), temperature source Oral, resp. rate 15, height 6' (1.829 m), weight 58.514 kg (129 lb).Body mass index is 17.49 kg/(m^2).  COGNITIVE FEATURES THAT CONTRIBUTE TO RISK:  Closed-mindedness, Polarized thinking and Thought constriction (tunnel vision)    SUICIDE RISK:   Mild:  Suicidal ideation of limited frequency, intensity, duration, and specificity.  There are no identifiable plans, no  associated intent, mild dysphoria and related symptoms, good self-control (both objective and subjective assessment), few other risk factors, and identifiable protective factors, including available and accessible social support.  PLAN OF CARE: Supportive approach/coping skills                               Alcohol /benzodiazepine dependence; use the Librium detox protocol add                                    Neurontin                                           Anxiety; will use an SSRI work with CBT/mindfulness                               Depression ; will use the SSRI to address both the anxiety and the depression Medical Decision Making:  Review of Psycho-Social Stressors (1), Review or order clinical lab tests (1), Review of Medication Regimen & Side Effects (2) and Review of New Medication or Change in Dosage (2)  I certify that inpatient services furnished can reasonably be expected to improve the patient's condition.   Caralyn Twining A 04/29/2015, 3:09 PM
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
## 2015-04-29 NOTE — Tx Team (Addendum)
Interdisciplinary Treatment Plan Update (Adult) Date: 04/29/2015   Time Reviewed: 9:30 AM  Progress in Treatment: Attending groups: Continuing to assess, patient new to milieu Participating in groups: Continuing to assess, patient new to milieu Taking medication as prescribed: Yes Tolerating medication: Yes Family/Significant other contact made: No, CSW assessing for appropriate contacts  Patient understands diagnosis: Yes Discussing patient identified problems/goals with staff: Yes Medical problems stabilized or resolved: Yes Denies suicidal/homicidal ideation: Yes Issues/concerns per patient self-inventory: Yes Other:  New problem(s) identified: N/A  Discharge Plan or Barriers:  6/17: CSW continuing to assess. Patient interested in discharging to [facility] for continued treatment.  Reason for Continuation of Hospitalization:  Depression Anxiety Medication Stabilization   Comments: N/A  Estimated length of stay: 3-5 days  For review of initial/current patient goals, please see plan of care.  Patient is a 34 year old Male admitted for alcohol detox. Patient will benefit from crisis stabilization, medication evaluation, group therapy, and psycho education in addition to case management for discharge planning. Patient and CSW reviewed pt's identified goals and treatment plan. Pt verbalized understanding and agreed to treatment plan.   Attendees: Patient:    Family:    Physician: Dr. Jama Flavors; [name] 04/29/2015 9:30 AM  Nursing: Dellia Cloud, [address], Patty Duke, Vivi Ferns, RN 04/29/2015 9:30 AM  Clinical Social Worker: Samuella Bruin, LCSWA 04/29/2015 9:30 AM  Other: Juline Patch, LCSW 04/29/2015 9:30 AM  Other: Leisa Lenz, Vesta Mixer Liaison 04/29/2015 9:30 AM  Other: Onnie Boer, Case Manager 04/29/2015 9:30 AM  Other: Mosetta Anis NP 04/29/2015 9:30 AM  Other: Chad Cordial, LCSWA 04/29/2015 9:30 AM        Scribe for  Treatment Team:  Samuella Bruin, MSW, LCSWA [phone]

## 2015-04-29 NOTE — ED Notes (Signed)
Pt has been accepted to [facility]. Call adult unit at [phone] ask Jay Nelson when is an acceptable time for him to be transferred.

## 2015-04-29 NOTE — BHH Group Notes (Signed)
   [facility] LCSW Aftercare Discharge Planning Group Note  04/29/2015  8:45 AM   Participation Quality: Alert, Appropriate and Oriented  Mood/Affect: Depressed and Flat  Depression Rating: 6  Anxiety Rating: 7  Thoughts of Suicide: Pt denies SI/HI  Will you contract for safety? Yes  Current AVH: Pt denies  Plan for Discharge/Comments: Pt attended discharge planning group and actively participated in group. CSW provided pt with today's workbook. Patient reports feeling "alright" today. He sees Dr. Sharlet Salina Main for medications but is wanting to go to [facility] at discharge.  Transportation Means: Pt reports access to transportation  Supports: No supports mentioned at this time  Samuella Bruin, MSW, [employer] Clinical Social Worker [employer] [phone]

## 2015-04-29 NOTE — ED Notes (Signed)
Pt left via [place] transportation

## 2015-04-29 NOTE — Progress Notes (Signed)
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

## 2015-04-29 NOTE — Tx Team (Signed)
Initial Interdisciplinary Treatment Plan   PATIENT STRESSORS: Marital or family conflict Medication change or noncompliance Substance abuse   PATIENT STRENGTHS: Capable of independent living General fund of knowledge Supportive family/friends   PROBLEM LIST: Problem List/Patient Goals Date to be addressed Date deferred Reason deferred Estimated date of resolution  I want to work on my drinking problem                                                        DISCHARGE CRITERIA:  Improved stabilization in mood, thinking, and/or behavior Verbal commitment to aftercare and medication compliance Withdrawal symptoms are absent or subacute and managed without 24-hour nursing intervention  PRELIMINARY DISCHARGE PLAN: Attend 12-step recovery group Return to previous living arrangement  PATIENT/FAMIILY INVOLVEMENT: This treatment plan has been presented to and reviewed with the patient, MITUL KUEFLER, and/or family member, .  The patient and family have been given the opportunity to ask questions and make suggestions.  Andrena Mews 04/29/2015, 4:11 AM

## 2015-04-29 NOTE — BHH Group Notes (Signed)
[facility] LCSW Group Therapy 04/29/2015 1:15 PM Type of Therapy: Group Therapy Participation Level: Active  Participation Quality: Attentive, Sharing and Supportive  Affect: Depressed and Flat  Cognitive: Alert and Oriented  Insight: Developing/Improving and Engaged  Engagement in Therapy: Developing/Improving and Engaged  Modes of Intervention: Clarification, Confrontation, Discussion, Education, Exploration, Limit-setting, Orientation, Problem-solving, Rapport Building, Dance movement psychotherapist, Socialization and Support  Summary of Progress/Problems: The topic for today was feelings about relapse. Pt discussed what relapse prevention is to them and identified triggers that they are on the path to relapse. Pt processed their feeling towards relapse and was able to relate to peers. Pt discussed coping skills that can be used for relapse prevention. Patient expressed feelings of shame and guilt related to his relapse behavior of drinking. He reports that his pride makes it difficult to ask for help when he needs it. Patient shared that he wants to take better care of himself moving forward in his recovery, instead of always focusing on others. CSW and other group members provided patient with emotional support and encouragement.   Samuella Bruin, MSW, [employer] Clinical Social Worker [facility] [phone]

## 2015-04-29 NOTE — ED Notes (Signed)
[phone] Ann Held Father

## 2015-04-29 NOTE — ED Provider Notes (Signed)
Pt accepted to [facility] Pt awake/alert, denies complaints Labs reviewed Appropriate for transfer   Zadie Rhine, MD 04/29/15 [phone]

## 2015-04-29 NOTE — Clinical Social Work Note (Signed)
CSW spoke with Jay Nelson at [facility] to make referral to residential program. Per Jay Nelson, patient would not be able to take Percocet or Xanex at facility. Clinicals faxed over for review.  Samuella Bruin, MSW, [employer] Clinical Social Worker [facility] [phone]

## 2015-04-29 NOTE — ED Notes (Signed)
[facility] called to take patient to [facility]. 30 minutes before they can transport.

## 2015-04-29 NOTE — BHH Suicide Risk Assessment (Signed)
[facility] Admission Suicide Risk Assessment   Nursing information obtained from:    Demographic factors:    Current Mental Status:    Loss Factors:    Historical Factors:    Risk Reduction Factors:    Total Time spent with patient: 45 minutes Principal Problem: Alcohol use disorder, severe, dependence Diagnosis:   Patient Active Problem List   Diagnosis Date Noted  . Major depressive disorder, single episode, severe [F32.2] 04/29/2015  . Alcohol use disorder, severe, dependence [F10.20] 04/29/2015  . Cannabis abuse [F12.10] 04/29/2015  . GAD (generalized anxiety disorder) [F41.1] 04/29/2015  . Social anxiety disorder [F40.10] 04/29/2015  . Benzodiazepine dependence, continuous [F13.20] 04/29/2015     Continued Clinical Symptoms:  Alcohol Use Disorder Identification Test Final Score (AUDIT): 25 The "Alcohol Use Disorders Identification Test", Guidelines for Use in Primary Care, Second Edition.  World Science writer [facility]). Score between 0-7:  no or low risk or alcohol related problems. Score between 8-15:  moderate risk of alcohol related problems. Score between 16-19:  high risk of alcohol related problems. Score 20 or above:  warrants further diagnostic evaluation for alcohol dependence and treatment.   CLINICAL FACTORS:   Severe Anxiety and/or Agitation Depression:   Comorbid alcohol abuse/dependence Impulsivity Alcohol/Substance Abuse/Dependencies   Musculoskeletal:  Psychiatric Specialty Exam: Physical Exam  ROS  Blood pressure 117/86, pulse 98, temperature 98.5 F (36.9 C), temperature source Oral, resp. rate 15, height 6' (1.829 m), weight 58.514 kg (129 lb).Body mass index is 17.49 kg/(m^2).    COGNITIVE FEATURES THAT CONTRIBUTE TO RISK:  Closed-mindedness, Polarized thinking and Thought constriction (tunnel vision)    SUICIDE RISK:   Mild:  Suicidal ideation of limited frequency, intensity, duration, and specificity.  There are no identifiable plans, no  associated intent, mild dysphoria and related symptoms, good self-control (both objective and subjective assessment), few other risk factors, and identifiable protective factors, including available and accessible social support.  PLAN OF CARE: Supportive approach/coping skills                               Alcohol /Benzodiazepine dependence; detox with Librium/add Neurontin                               Anxiety; work with an SSRI/CBT/mindfulness                               Depression; work with an SSRI                              Work a relapse prevention plan                                Explore residential treatment options  Medical Decision Making:  Review of Psycho-Social Stressors (1), Review or order clinical lab tests (1), Review of Medication Regimen & Side Effects (2) and Review of New Medication or Change in Dosage (2)  I certify that inpatient services furnished can reasonably be expected to improve the patient's condition.   Mersadie Kavanaugh A 04/29/2015, 3:14 PM

## 2015-04-29 NOTE — Progress Notes (Signed)
D:  Per pt self inventory pt reports sleeping fair, appetite fair, energy level normal, ability to pay attention good, rates depression at a 5 out of 10, hopelessness at a 3 out of 10, anxiety at a 5 out of 10, denies SI/HI/AVH, goal today: "Self, pray and work"     A:  Emotional support provided, Encouraged pt to continue with treatment plan and attend all group activities, q15 min checks maintained for safety.  R:  Pt is receptive, going to groups and activities, pleasant and cooperative with staff and other patients on the unit.

## 2015-04-30 NOTE — Progress Notes (Signed)
Patient did attend the evening speaker AA meeting.  

## 2015-04-30 NOTE — Progress Notes (Signed)
D: Pt has anxious affect and mood.  Pt reports he "had a pretty good day.  When I get out of here I'm going to [facility].  My counselor done some of my paperwork up there, hopefully they'll have a bed ready by Monday."  Pt reports withdrawal symptoms of tremor and anxiety.Pt denies SI/HI, denies hallucinations.  Pt has been visible in milieu interacting with peers and staff appropriately.  Pt attended evening group.   A: Introduced self to pt.  Met with pt 1:1 and provided support and encouragement.  Actively listened to pt.  Medications administered per order.  PRN medication administered for chronic back pain and anxiety. R: Pt is compliant with medications.  Pt verbally contracts for safety.  Will continue to monitor and assess.

## 2015-04-30 NOTE — Plan of Care (Signed)
Problem: Alteration in mood & ability to function due to Goal: LTG-Pt reports reduction in suicidal thoughts (Patient reports reduction in suicidal thoughts and is able to verbalize a safety plan for whenever patient is feeling suicidal)  Outcome: Progressing Pt denied SI this shift.  He verbally contracted for safety.    Problem: Diagnosis: Increased Risk For Suicide Attempt Goal: STG-Patient Will Comply With Medication Regime Outcome: Progressing Pt has been compliant with all medications this shift.

## 2015-04-30 NOTE — Progress Notes (Signed)
[facility] MD Progress Note  04/30/2015 4:54 PM Jay Nelson  MRN:  [id number]  Subjective: Jay Nelson reports, "I'm still having some substance withdrawals, tremors"  Objective: Patient is seen and chart is reviewed. He is reporting decreasing symptoms of substance withdrawals, however, still endorsing some tremors.Marland Kitchen He reports that his mood seems to be stabilizing; although admits feeling some depression. Jay Nelson was educated on long term effects of alcoholism as alcohol is considered a depressant. He is receptive of the information provided. He was cooperative with assessment and appears to be interacting well on the unit. He denies any adverse reactions to medications and is compliant with his treatment regimen. He says his family is about to deposit some money for his substance abuse treatment at [facility]. He hopes to be discharged on Monday to go to the [facility].  Principal Problem: Alcohol use disorder, severe, dependence  Diagnosis:   Patient Active Problem List   Diagnosis Date Noted  . Major depressive disorder, single episode, severe [F32.2] 04/29/2015  . Alcohol use disorder, severe, dependence [F10.20] 04/29/2015  . Cannabis abuse [F12.10] 04/29/2015  . GAD (generalized anxiety disorder) [F41.1] 04/29/2015  . Social anxiety disorder [F40.10] 04/29/2015  . Benzodiazepine dependence, continuous [F13.20] 04/29/2015   Total Time spent with patient: 35 minutes  Past Medical History:  Past Medical History  Diagnosis Date  . Anxiety   . Asthma   . Chronic back pain    History reviewed. No pertinent past surgical history. Family History: History reviewed. No pertinent family history.  Social History:  History  Alcohol Use  . Yes     History  Drug Use  . Yes  . Special: Marijuana    History   Social History  . Marital Status: Single    Spouse Name: N/A  . Number of Children: N/A  . Years of Education: N/A   Social History Main Topics  . Smoking status:  Current Every Day Smoker  . Smokeless tobacco: Not on file  . Alcohol Use: Yes  . Drug Use: Yes    Special: Marijuana  . Sexual Activity: Not on file   Other Topics Concern  . None   Social History Narrative   Additional History:    Sleep: Fair  Appetite:  Fair  Musculoskeletal: Strength & Muscle Tone: within normal limits Gait & Station: normal Patient leans: N/A  Psychiatric Specialty Exam: Physical Exam  Review of Systems  Constitutional: Positive for chills and malaise/fatigue.  HENT: Negative.   Eyes: Negative.   Cardiovascular: Negative.   Gastrointestinal: Negative.   Genitourinary: Negative.   Musculoskeletal: Negative.   Skin: Negative.   Neurological: Positive for dizziness, tremors and weakness.  Endo/Heme/Allergies: Negative.   Psychiatric/Behavioral: Positive for depression ("Improving") and substance abuse (Alcoholism, chronic). Negative for suicidal ideas, hallucinations and memory loss. The patient is nervous/anxious and has insomnia.     Blood pressure 120/86, pulse 64, temperature 97.5 F (36.4 C), temperature source Oral, resp. rate 18, height 6' (1.829 m), weight 58.514 kg (129 lb).Body mass index is 17.49 kg/(m^2).  General Appearance: Casual  Eye Contact::  Good  Speech:  Clear and Coherent  Volume:  Normal  Mood:  "Improving'  Affect:  Appropriate and Congruent  Thought Process:  Coherent and Intact  Orientation:  Full (Time, Place, and Person)  Thought Content:  Denies any hallucinations  Suicidal Thoughts:  No  Homicidal Thoughts:  No  Memory:  Grossly intact  Judgement:  Good  Insight:  Present  Psychomotor  Activity:  Tremor  Concentration:  Fair  Recall:  Good  Fund of Knowledge:Fair  Language: Good  Akathisia:  No  Handed:  Right  AIMS (if indicated):     Assets:  Communication Skills Desire for Improvement  ADL's:  Intact  Cognition: WNL  Sleep:  Number of Hours: 6.5   Current Medications: Current Facility-Administered  Medications  Medication Dose Route Frequency Provider Last Rate Last Dose  . acetaminophen (TYLENOL) tablet 650 mg  650 mg Oral Q6H PRN Harriet Butte, NP   650 mg at 04/29/15 2000  . alum & mag hydroxide-simeth (MAALOX/MYLANTA) 200-200-20 MG/5ML suspension 30 mL  30 mL Oral Q4H PRN Harriet Butte, NP      . chlordiazePOXIDE (LIBRIUM) capsule 25 mg  25 mg Oral Q6H PRN Harriet Butte, NP   25 mg at 04/29/15 0355  . chlordiazePOXIDE (LIBRIUM) capsule 25 mg  25 mg Oral TID Harriet Butte, NP       Followed by  . [START ON 05/01/2015] chlordiazePOXIDE (LIBRIUM) capsule 25 mg  25 mg Oral BH-qamhs Harriet Butte, NP       Followed by  . [START ON 05/03/2015] chlordiazePOXIDE (LIBRIUM) capsule 25 mg  25 mg Oral Daily Harriet Butte, NP      . feeding supplement (ENSURE ENLIVE) (ENSURE ENLIVE) liquid 237 mL  237 mL Oral BID BM Nicholaus Bloom, MD   237 mL at 04/30/15 1508  . gabapentin (NEURONTIN) capsule 300 mg  300 mg Oral TID Nicholaus Bloom, MD   300 mg at 04/30/15 1249  . gabapentin (NEURONTIN) capsule 300 mg  300 mg Oral QHS Nicholaus Bloom, MD   300 mg at 04/29/15 2118  . hydrOXYzine (ATARAX/VISTARIL) tablet 25 mg  25 mg Oral Q6H PRN Harriet Butte, NP   25 mg at 04/29/15 2119  . loperamide (IMODIUM) capsule 2-4 mg  2-4 mg Oral PRN Harriet Butte, NP      . magnesium hydroxide (MILK OF MAGNESIA) suspension 30 mL  30 mL Oral Daily PRN Harriet Butte, NP      . multivitamin with minerals tablet 1 tablet  1 tablet Oral Daily Harriet Butte, NP   1 tablet at 04/30/15 0820  . nicotine (NICODERM CQ - dosed in mg/24 hours) patch 21 mg  21 mg Transdermal Daily Nicholaus Bloom, MD   21 mg at 04/30/15 0844  . ondansetron (ZOFRAN-ODT) disintegrating tablet 4 mg  4 mg Oral Q6H PRN Harriet Butte, NP      . thiamine (B-1) injection 100 mg  100 mg Intramuscular Once Harriet Butte, NP   100 mg at 04/29/15 0358  . thiamine (VITAMIN B-1) tablet 100 mg  100 mg Oral Daily Harriet Butte, NP   100 mg at 04/30/15  6967    Lab Results:  Results for orders placed or performed during the hospital encounter of 04/29/15 (from the past 48 hour(s))  Comprehensive metabolic panel     Status: None   Collection Time: 04/29/15  7:10 PM  Result Value Ref Range   Sodium 141 135 - 145 mmol/L   Potassium 4.0 3.5 - 5.1 mmol/L   Chloride 103 101 - 111 mmol/L   CO2 29 22 - 32 mmol/L   Glucose, Bld 80 65 - 99 mg/dL   BUN 14 6 - 20 mg/dL   Creatinine, Ser 0.99 0.61 - 1.24 mg/dL   Calcium 9.6 8.9 - 10.3 mg/dL  Total Protein 7.7 6.5 - 8.1 g/dL   Albumin 4.7 3.5 - 5.0 g/dL   AST 25 15 - 41 U/L   ALT 21 17 - 63 U/L   Alkaline Phosphatase 82 38 - 126 U/L   Total Bilirubin 0.4 0.3 - 1.2 mg/dL   GFR calc non Af Amer >60 >60 mL/min   GFR calc Af Amer >60 >60 mL/min    Comment: (NOTE) The eGFR has been calculated using the CKD EPI equation. This calculation has not been validated in all clinical situations. eGFR's persistently <60 mL/min signify possible Chronic Kidney Disease.    Anion gap 9 5 - 15    Comment: Performed at [facility]   Physical Findings: AIMS: Facial and Oral Movements Muscles of Facial Expression: None, normal Lips and Perioral Area: None, normal Jaw: None, normal Tongue: None, normal,Extremity Movements Upper (arms, wrists, hands, fingers): None, normal Lower (legs, knees, ankles, toes): None, normal, Trunk Movements Neck, shoulders, hips: None, normal, Overall Severity Severity of abnormal movements (highest score from questions above): None, normal Incapacitation due to abnormal movements: None, normal, Dental Status Current problems with teeth and/or dentures?: No Does patient usually wear dentures?: No  CIWA:  CIWA-Ar Total: 3 COWS:     Treatment Plan Summary: Daily contact with patient to assess and evaluate symptoms and progress in treatment and Medication management;  Plan: Supportive approach/coping skills/relapse prevention           Depression: Continue  to work with mindfulness, CBT help  identify the cognitive distortions that keep the depression going, declines an antidepressant medication at this this time.          Discuss other life style changes that can help with both his depression and his alcohol use such like exercise, meditation           Alcohol Dependence: will continue the Librium detox protocols, will use motivational interviewing and encourage to pursue total abstinence      Has plans to be at the [facility] treatment center on Monday.           Continue to educate in terms of AA and other recovery strategies  Medical Decision Making:  Established Problem, Stable/Improving (1), Review of Psycho-Social Stressors (1), Review of Last Therapy Session (1), Review of Medication Regimen & Side Effects (2) and Review of New Medication or Change in Dosage (2)  Encarnacion Slates, PMHNP-BC 04/30/2015, 4:54 PM  Agree with NP Progress Note as above,  Neita Garnet ,MD

## 2015-04-30 NOTE — BHH Group Notes (Signed)
BHH Group Notes:  (Clinical Social Work)  04/30/2015     10-11AM  Summary of Progress/Problems:   The main focus of today's process group was to learn how to use a decisional balance exercise to move forward in the Stages of Change, which were described and discussed.  Motivational Interviewing and a worksheet were utilized to help patients explore in depth the perceived benefits and costs of unhealthy coping techniques, as well as the  benefits and costs of replacing that with a healthy coping skills.   The patient expressed that his unhealthy coping involves substance abuse, which he uses to cope with anxiety, for one reason.  He participated in the discussion well.  Type of Therapy:  Group Therapy - Process   Participation Level:  Active  Participation Quality:  Attentive and Supportive  Affect:  Appropriate  Cognitive:  Oriented  Insight:  Engaged  Engagement in Therapy:  Engaged  Modes of Intervention:  Education, Motivational Interviewing  Ambrose Mantle, LCSW 04/30/2015, 12:29 PM

## 2015-04-30 NOTE — BHH Group Notes (Signed)
BHH Group Notes:  (Nursing/MHT/Case Management/Adjunct)  Date:  04/30/2015  Time:  10:45 AM  Type of Therapy:  Nurse Education  Participation Level:  Active  Participation Quality:  Appropriate  Affect:  Appropriate  Cognitive:  Alert and Oriented  Insight:  Appropriate  Engagement in Group:  Developing/Improving  Modes of Intervention:  Discussion and Exploration  Summary of Progress/Problems: Group discussion included preparing for discharged and effective coping skills. Pt identified [place] as a place she always wanted to visit.  Jimmey Ralph 04/30/2015, 10:45 AM

## 2015-04-30 NOTE — Progress Notes (Signed)
Nursing Progress Note: 7-7p  D- Mood is depressed and anxious,rates anxiety at 5/10. Affect is blunted and appropriate. Pt is able to contract for safety. Continues to have difficulty staying asleep.   A - Observed pt interacting in group and in the milieu.Support and encouragement offered, safety maintained with q 15 minutes. Pt states longest period of sobriety was 120 days but is hopeful "[facility]" will be the place . Slight tremors noted. Enjoys dirt biking, when he can.  R-Contracts for safety and continues to follow treatment plan, working on learning new coping skills for sobriety.

## 2015-05-01 DIAGNOSIS — F322 Major depressive disorder, single episode, severe without psychotic features: Secondary | ICD-10-CM | POA: Insufficient documentation

## 2015-05-01 NOTE — Progress Notes (Signed)
Patient did attend the evening speaker AA meeting.  

## 2015-05-01 NOTE — Progress Notes (Signed)
D: Pt has anxious affect and mood.  Pt reports he visited with his son and his mother stating, "it was tough when they left."  Pt reports it was a good visit.  Pt reports his goal today was "not to be so stressed out, breathe and realize that I just gotta take it 1 minute at a time."  Pt denies SI/HI, denies hallucinations.  Pt has been visible in milieu interacting with peers and staff appropriately.  Pt attended evening group.   A:  Met with pt 1:1 and provided support and encouragement.  Actively listened to pt.  Medications administered per order.  PRN medication administered for pain and anxiety.  Heat packs provided for pain. R: Pt is compliant with medications.  Pt verbally contracts for safety.  Will continue to monitor and assess.

## 2015-05-01 NOTE — BHH Group Notes (Signed)
The focus of this group is to educate the patient on the purpose and policies of crisis stabilization and provide a format to answer questions about their admission.  The group details unit policies and expectations of patients while admitted.  Patient attended 0900 nurse education orientation group this morning.  Patient actively participated, appropriate affect.  Patient was alert, had appropriate insight and engagement.  Today patient will work on 3 goals for discharge.

## 2015-05-01 NOTE — Progress Notes (Signed)
D:  Patient's self inventory sheet, patient has fair sleep, sleep medication is helpful.  Good appetite, normal energy level, good concentration.  Rated depression and anxiety 5, hopeless 2.  Withdrawals, tremors.  Denied SI.  Physical problems, pain, back, worst pain #8 in past 24 hours.  Goal is to work on himself today.  Plans to be positive.  Does have discharge plans.  No problems anticipated after discharge. A:  Medications administered per MD orders.  Emotional support and encouragement given patient. R:  Denied SI and Hi, contracts for safety.  Denied A/V hallucinations.  Safety maintained with 15 minute checks.

## 2015-05-01 NOTE — Progress Notes (Addendum)
[facility] MD Progress Note  05/01/2015 3:53 PM Jay Nelson  MRN:  [id number]  Subjective: Jay Nelson reports, "I'm feeling good today, ready to be discharged tomorrow"  Objective: Patient is seen and chart is reviewed. He is reporting feeling good today. He reports that his mood stabilized. Terrian was educated on long term effects of alcoholism as alcohol is considered a depressant. He is encouraged to participate in the substance abuse treatment after discharge as planned. He is receptive of the information provided. He is cooperative with assessment and appears to be interacting well on the unit. He denies any adverse reactions to medications and is compliant with his treatment regimen. He says his son will be coming to see him today for father's visit. He hopes to be discharged on Monday to go to the [facility].  Principal Problem: Alcohol use disorder, severe, dependence  Diagnosis:   Patient Active Problem List   Diagnosis Date Noted  . Major depressive disorder, single episode, severe [F32.2] 04/29/2015  . Alcohol use disorder, severe, dependence [F10.20] 04/29/2015  . Cannabis abuse [F12.10] 04/29/2015  . GAD (generalized anxiety disorder) [F41.1] 04/29/2015  . Social anxiety disorder [F40.10] 04/29/2015  . Benzodiazepine dependence, continuous [F13.20] 04/29/2015   Total Time spent with patient: 35 minutes  Past Medical History:  Past Medical History  Diagnosis Date  . Anxiety   . Asthma   . Chronic back pain    History reviewed. No pertinent past surgical history. Family History: History reviewed. No pertinent family history.  Social History:  History  Alcohol Use  . Yes     History  Drug Use  . Yes  . Special: Marijuana    History   Social History  . Marital Status: Single    Spouse Name: N/A  . Number of Children: N/A  . Years of Education: N/A   Social History Main Topics  . Smoking status: Current Every Day Smoker  . Smokeless tobacco: Not on  file  . Alcohol Use: Yes  . Drug Use: Yes    Special: Marijuana  . Sexual Activity: Not on file   Other Topics Concern  . None   Social History Narrative   Additional History:    Sleep: Fair  Appetite:  Fair  Musculoskeletal: Strength & Muscle Tone: within normal limits Gait & Station: normal Patient leans: N/A  Psychiatric Specialty Exam: Physical Exam  Review of Systems  Constitutional: Positive for chills and malaise/fatigue.  HENT: Negative.   Eyes: Negative.   Cardiovascular: Negative.   Gastrointestinal: Negative.   Genitourinary: Negative.   Musculoskeletal: Negative.   Skin: Negative.   Neurological: Positive for dizziness, tremors and weakness.  Endo/Heme/Allergies: Negative.   Psychiatric/Behavioral: Positive for depression ("Improving") and substance abuse (Alcoholism, chronic). Negative for suicidal ideas, hallucinations and memory loss. The patient is nervous/anxious and has insomnia.     Blood pressure 130/70, pulse 65, temperature 98.2 F (36.8 C), temperature source Oral, resp. rate 16, height 6' (1.829 m), weight 58.514 kg (129 lb).Body mass index is 17.49 kg/(m^2).  General Appearance: Casual  Eye Contact::  Good  Speech:  Clear and Coherent  Volume:  Normal  Mood:  "Improving'  Affect:  Appropriate and Congruent  Thought Process:  Coherent and Intact  Orientation:  Full (Time, Place, and Person)  Thought Content:  Denies any hallucinations  Suicidal Thoughts:  No  Homicidal Thoughts:  No  Memory:  Grossly intact  Judgement:  Good  Insight:  Present  Psychomotor Activity:  Tremor  Concentration:  Fair  Recall:  Roel Cluck of Knowledge:Fair  Language: Good  Akathisia:  No  Handed:  Right  AIMS (if indicated):     Assets:  Communication Skills Desire for Improvement  ADL's:  Intact  Cognition: WNL  Sleep:  Number of Hours: 6.5   Current Medications: Current Facility-Administered Medications  Medication Dose Route Frequency Provider  Last Rate Last Dose  . acetaminophen (TYLENOL) tablet 650 mg  650 mg Oral Q6H PRN Harriet Butte, NP   650 mg at 04/30/15 1958  . alum & mag hydroxide-simeth (MAALOX/MYLANTA) 200-200-20 MG/5ML suspension 30 mL  30 mL Oral Q4H PRN Harriet Butte, NP      . chlordiazePOXIDE (LIBRIUM) capsule 25 mg  25 mg Oral Q6H PRN Harriet Butte, NP   25 mg at 04/29/15 0355  . chlordiazePOXIDE (LIBRIUM) capsule 25 mg  25 mg Oral BH-qamhs Harriet Butte, NP       Followed by  . [START ON 05/03/2015] chlordiazePOXIDE (LIBRIUM) capsule 25 mg  25 mg Oral Daily Harriet Butte, NP      . feeding supplement (ENSURE ENLIVE) (ENSURE ENLIVE) liquid 237 mL  237 mL Oral BID BM Nicholaus Bloom, MD   237 mL at 05/01/15 1100  . gabapentin (NEURONTIN) capsule 300 mg  300 mg Oral TID Nicholaus Bloom, MD   300 mg at 05/01/15 1152  . gabapentin (NEURONTIN) capsule 300 mg  300 mg Oral QHS Nicholaus Bloom, MD   300 mg at 04/30/15 2112  . hydrOXYzine (ATARAX/VISTARIL) tablet 25 mg  25 mg Oral Q6H PRN Harriet Butte, NP   25 mg at 04/30/15 1958  . loperamide (IMODIUM) capsule 2-4 mg  2-4 mg Oral PRN Harriet Butte, NP      . magnesium hydroxide (MILK OF MAGNESIA) suspension 30 mL  30 mL Oral Daily PRN Harriet Butte, NP      . multivitamin with minerals tablet 1 tablet  1 tablet Oral Daily Harriet Butte, NP   1 tablet at 05/01/15 2440  . nicotine (NICODERM CQ - dosed in mg/24 hours) patch 21 mg  21 mg Transdermal Daily Nicholaus Bloom, MD   21 mg at 05/01/15 1027  . ondansetron (ZOFRAN-ODT) disintegrating tablet 4 mg  4 mg Oral Q6H PRN Harriet Butte, NP      . thiamine (B-1) injection 100 mg  100 mg Intramuscular Once Harriet Butte, NP   100 mg at 04/29/15 0358  . thiamine (VITAMIN B-1) tablet 100 mg  100 mg Oral Daily Harriet Butte, NP   100 mg at 05/01/15 2536    Lab Results:  Results for orders placed or performed during the hospital encounter of 04/29/15 (from the past 48 hour(s))  Comprehensive metabolic panel      Status: None   Collection Time: 04/29/15  7:10 PM  Result Value Ref Range   Sodium 141 135 - 145 mmol/L   Potassium 4.0 3.5 - 5.1 mmol/L   Chloride 103 101 - 111 mmol/L   CO2 29 22 - 32 mmol/L   Glucose, Bld 80 65 - 99 mg/dL   BUN 14 6 - 20 mg/dL   Creatinine, Ser 0.99 0.61 - 1.24 mg/dL   Calcium 9.6 8.9 - 10.3 mg/dL   Total Protein 7.7 6.5 - 8.1 g/dL   Albumin 4.7 3.5 - 5.0 g/dL   AST 25 15 - 41 U/L   ALT 21 17 - 63 U/L  Alkaline Phosphatase 82 38 - 126 U/L   Total Bilirubin 0.4 0.3 - 1.2 mg/dL   GFR calc non Af Amer >60 >60 mL/min   GFR calc Af Amer >60 >60 mL/min    Comment: (NOTE) The eGFR has been calculated using the CKD EPI equation. This calculation has not been validated in all clinical situations. eGFR's persistently <60 mL/min signify possible Chronic Kidney Disease.    Anion gap 9 5 - 15    Comment: Performed at [facility]   Physical Findings: AIMS: Facial and Oral Movements Muscles of Facial Expression: None, normal Lips and Perioral Area: None, normal Jaw: None, normal Tongue: None, normal,Extremity Movements Upper (arms, wrists, hands, fingers): None, normal Lower (legs, knees, ankles, toes): None, normal, Trunk Movements Neck, shoulders, hips: None, normal, Overall Severity Severity of abnormal movements (highest score from questions above): None, normal Incapacitation due to abnormal movements: None, normal Patient's awareness of abnormal movements (rate only patient's report): No Awareness, Dental Status Current problems with teeth and/or dentures?: No Does patient usually wear dentures?: No  CIWA:  CIWA-Ar Total: 1 COWS:  COWS Total Score: 1  Treatment Plan Summary: Daily contact with patient to assess and evaluate symptoms and progress in treatment and Medication management;  Plan: Supportive approach/coping skills/relapse prevention           Depression: Continue to work with mindfulness, CBT help  identify the cognitive  distortions that keep the depression going, declines an antidepressant medication at this this time.          Discuss other life style changes that can help with both his depression and his alcohol use such like exercise, meditation           Alcohol Dependence: will continue the Librium detox protocols, will use motivational interviewing and encourage to pursue total abstinence      Has plans to be at the [facility] treatment center on Monday.           Continue to educate in terms of AA and other recovery strategies  Medical Decision Making:  Established Problem, Stable/Improving (1), Review of Psycho-Social Stressors (1), Review of Last Therapy Session (1), Review of Medication Regimen & Side Effects (2) and Review of New Medication or Change in Dosage (2)  Encarnacion Slates, PMHNP-BC 05/01/2015, 3:53 PM Agree with NP Progress Note as above,  Neita Garnet ,MD

## 2015-05-01 NOTE — Progress Notes (Signed)
D: Pt has anxious affect and mood.  Pt reports he is anxious because "I called my mama to make sure my son is all right.  My daddy is strung out on fentanyl patches."  Pt reported that his father has struggled with addiction and he is worried about him.  Pt stated "I do got a bed at [facility], I'm going straight there, leaving here around 12 tomorrow."  Pt reports he plans to get a sponsor after discharge.  Pt states "I'm staying focused on me."  Pt denies SI/HI, denies hallucinations.  Pt has been visible in milieu interacting with peers and staff appropriately.  Pt attended evening group.   A:   Met with pt 1:1 and provided support and encouragement.  Actively listened to pt.  Positive coping skills reinforced.  Medications administered per order.  PRN medication administered for anxiety and pain.  Heat packs provided for pain.   R: Pt is compliant with medications.  Pt verbally contracts for safety.  Will continue to monitor and assess.

## 2015-05-01 NOTE — Plan of Care (Signed)
Problem: Diagnosis: Increased Risk For Suicide Attempt Goal: STG-Patient Will Attend All Groups On The Unit Outcome: Progressing Pt attended evening group on 04/30/15.

## 2015-05-01 NOTE — B[facility] Group Notes (Signed)
[facility] Group Notes:  (Clinical Social Work)  05/01/2015  10:00-11:00AM  Summary of Progress/Problems:   The main focus of today's process group was to   1)  discuss the importance of adding supports  2)  identify the patient's current healthy supports  3)  discuss reasons it is so difficult to utilize supports and  4)  begin to plan what supports to add.  An emphasis was placed on changing at least one part of what was in place prior to hospitalization by using counselor, doctor, therapy groups, 12-step groups, a sponsor, and support groups.  The patient said one thing he needs to do is get a sponsor at AA, because he has gone to the meetings for a long time without a sponsor, and he realizes he needs that level of accountability.  As other patients expressed their feelings and told their stories of loss, he was very supportive.   Type of Therapy:  Process Group with Motivational Interviewing  Participation Level:  Active  Participation Quality:  Attentive and Supportive  Affect:  Blunted  Cognitive:  Alert and Appropriate  Insight:  Engaged  Engagement in Therapy:  Engaged  Modes of Intervention:   Education, Support and Processing, Activity  Ambrose Mantle, LCSW 05/01/2015

## 2015-05-02 DIAGNOSIS — F322 Major depressive disorder, single episode, severe without psychotic features: Secondary | ICD-10-CM

## 2015-05-02 DIAGNOSIS — F1020 Alcohol dependence, uncomplicated: Principal | ICD-10-CM

## 2015-05-02 MED ORDER — GABAPENTIN 300 MG PO CAPS: ORAL_CAPSULE | ORAL | Status: DC

## 2015-05-02 MED ORDER — NICOTINE 21 MG/24HR TD PT24: 21.0000 mg | MEDICATED_PATCH | Freq: Every day | TRANSDERMAL | Status: DC

## 2015-05-02 MED ORDER — ADULT MULTIVITAMIN W/MINERALS CH: 1.0000 | ORAL_TABLET | Freq: Every day | ORAL | Status: DC

## 2015-05-02 NOTE — Progress Notes (Signed)
Pt attended spiritual care group on grief and loss facilitated by chaplain Burnis Kingfisher. Group opened with brief discussion and psycho-social ed around grief and loss in relationships and in relation to self - identifying life patterns, circumstances, changes that cause losses. Established group norm of speaking from own life experience. Group goal of establishing open and affirming space for members to share loss and experience with grief, normalize grief experience and provide psycho social education and grief support.  Group drew on narrative and Alderian therapeutic modalities.     Jay Nelson was present throughout group and was alert and oriented with appropriate affect.  He identified with several group members who described their grief as "feeling like I'm in a tornado."  Jay Nelson expressed that he had experienced some losses, but was not specific about these.  He showed affirmation during psycho-social ed part of group - especially around losses related to self.    Belva Crome MDiv

## 2015-05-02 NOTE — Plan of Care (Signed)
Problem: Alteration in mood & ability to function due to Goal: STG-Patient will attend groups Outcome: Progressing Pt attended evening group on 05/01/15.

## 2015-05-02 NOTE — BHH Suicide Risk Assessment (Signed)
B[facility] INPATIENT:  Family/Significant Other Suicide Prevention Education  Suicide Prevention Education:  Education Completed; Mother Fintan Dodson [phone],  (name of family member/significant other) has been identified by the patient as the family member/significant other with whom the patient will be residing, and identified as the person(s) who will aid the patient in the event of a mental health crisis (suicidal ideations/suicide attempt).  With written consent from the patient, the family member/significant other has been provided the following suicide prevention education, prior to the and/or following the discharge of the patient.  The suicide prevention education provided includes the following:  Suicide risk factors  Suicide prevention and interventions  National Suicide Hotline telephone number  [facility] assessment telephone number  [facility] Emergency Assistance 911  [facility] and/or Residential Mobile Crisis Unit telephone number  Request made of family/significant other to:  Remove weapons (e.g., guns, rifles, knives), all items previously/currently identified as safety concern.    Remove drugs/medications (over-the-counter, prescriptions, illicit drugs), all items previously/currently identified as a safety concern.  The family member/significant other verbalizes understanding of the suicide prevention education information provided.  The family member/significant other agrees to remove the items of safety concern listed above.  Philippe Gang, [place] 05/02/2015, 12:01 PM

## 2015-05-02 NOTE — BHH Group Notes (Signed)
   [facility] LCSW Aftercare Discharge Planning Group Note  05/02/2015  8:45 AM   Participation Quality: Alert, Appropriate and Oriented  Mood/Affect: Depressed and Flat  Depression Rating: 3  Anxiety Rating: 5  Thoughts of Suicide: Pt denies SI/HI  Will you contract for safety? Yes  Current AVH: Pt denies  Plan for Discharge/Comments: Pt attended discharge planning group and actively participated in group. CSW provided pt with today's workbook. Patient hopes to discharge to [facility] for continued treatment.  Transportation Means: Pt reports access to transportation  Supports: No supports mentioned at this time  Samuella Bruin, MSW, [employer] Clinical Social Worker [employer] [phone]

## 2015-05-02 NOTE — Progress Notes (Signed)
  [facility] Adult Case Management Discharge Plan :  Will you be returning to the same living situation after discharge:  No. Patient to discharge to [facility] treatment center At discharge, do you have transportation home?: Yes,  mother to provide transport Do you have the ability to pay for your medications: Yes,  patient will be provided with any necessary prescriptions  Release of information consent forms completed and in the chart;  Patient's signature needed at discharge.  Patient to Follow up at: Follow-up Information    Follow up with [facility]. On 05/02/2015.   Why:  Please arrive at [facility] on Monday June 20th by 5:30 pm for continued treatment. Please call if you need to reschedule.   Contact information:   [address] [place] [phone]       Patient denies SI/HI: Yes,  denies    Safety Planning and Suicide Prevention discussed: Yes,  with patient and mother  Have you used any form of tobacco in the last 30 days? (Cigarettes, Smokeless Tobacco, Cigars, and/or Pipes): Yes  Has patient been referred to the Quitline?: Patient refused referral  Shyane Fossum, [place] 05/02/2015, 12:04 PM

## 2015-05-02 NOTE — BHH Suicide Risk Assessment (Signed)
[facility] Discharge Suicide Risk Assessment   Demographic Factors:  34 year old , currently unemployed, has one child, currently with his mother   Total Time spent with patient: 30 minutes  Musculoskeletal: Strength & Muscle Tone: within normal limits Gait & Station: normal Patient leans: N/A  Psychiatric Specialty Exam: Physical Exam  ROS  Blood pressure 128/82, pulse 64, temperature 97.5 F (36.4 C), temperature source Oral, resp. rate 16, height 6' (1.829 m), weight 129 lb (58.514 kg).Body mass index is 17.49 kg/(m^2).  General Appearance: Well Groomed  Eye Contact::  Good  Speech:  Normal Rate409  Volume:  Normal  Mood:  mood is "OK", denies depression  Affect:  Appropriate and reactive   Thought Process:  Goal Directed and Linear  Orientation:  Full (Time, Place, and Person)  Thought Content:  denies hallucinations, no delusions  Suicidal Thoughts:  No- denies any thoughts of hurting self or anyone else   Homicidal Thoughts:  No  Memory:  recent and remote grossly intact   Judgement:  Other:  improved   Insight:  Present  Psychomotor Activity:  Normal- no psychomotor restlessness, slight distal tremors, no diaphoresis, appears calm  Concentration:  Good  Recall:  Good  Fund of Knowledge:Good  Language: Good  Akathisia:  Negative  Handed:  Right  AIMS (if indicated):     Assets:  Desire for Improvement Resilience Social Support  Sleep:  Number of Hours: 6.5  Cognition: WNL  ADL's:  Improved    Have you used any form of tobacco in the last 30 days? (Cigarettes, Smokeless Tobacco, Cigars, and/or Pipes): Yes  Has this patient used any form of tobacco in the last 30 days? (Cigarettes, Smokeless Tobacco, Cigars, and/or Pipes) Yes, A prescription for an FDA-approved tobacco cessation medication was offered at discharge and the patient refused  Mental Status Per Nursing Assessment::   On Admission:     Current Mental Status by Physician: At this time patient is improved  compared to admission- mood is improved, affect is improved, although he remains somewhat anxious, which he attributes to being fearful that  Rehab he is going to [facility] ) may somehow not take him, although he states he has been accepted and this has been confirmed with Child psychotherapist. Mood improved, no thought disorder, denies hallucinations, no delusions , future oriented, states he is very motivated in staying sober .  Loss Factors: States " I was jut very tired of drinking and using dope ".   Historical Factors: History of Depression, history of alcohol dependence, BZD Abuse , no history of suicide attempts  Risk Reduction Factors:   Sense of responsibility to family, Positive social support and Positive coping skills or problem solving skills  Continued Clinical Symptoms:  As noted, currently much improved - states mood is improved. Minimal distal tremors noted, but vitals stable and patient calm and in no acute distress. Anxious about disposition but SW confirms he has a bed available at [facility] ( Residential  Rehab Setting) later today- patient states mother will come pick him up.  Denies any medications side effects  Cognitive Features That Contribute To Risk:  No gross cognitive deficits noted upon discharge. Is alert , attentive, and oriented x 3    Suicide Risk:  Mild:  Suicidal ideation of limited frequency, intensity, duration, and specificity.  There are no identifiable plans, no associated intent, mild dysphoria and related symptoms, good self-control (both objective and subjective assessment), few other  risk factors, and identifiable protective factors, including available and accessible social support.  Principal Problem: Alcohol use disorder, severe, dependence Discharge Diagnoses:  Patient Active Problem List   Diagnosis Date Noted  . Major depressive disorder, single episode, severe without psychotic features [F32.2]   . Major  depressive disorder, single episode, severe [F32.2] 04/29/2015  . Alcohol use disorder, severe, dependence [F10.20] 04/29/2015  . Cannabis abuse [F12.10] 04/29/2015  . GAD (generalized anxiety disorder) [F41.1] 04/29/2015  . Social anxiety disorder [F40.10] 04/29/2015  . Benzodiazepine dependence, continuous [F13.20] 04/29/2015    Follow-up Information    Follow up with [facility]   Why:  Referral sent 6/17.       Plan Of Care/Follow-up recommendations:  Activity:  as tolerated Diet:  Regular Tests:  NA Other:  See below  Is patient on multiple antipsychotic therapies at discharge:  No   Has Patient had three or more failed trials of antipsychotic monotherapy by history:  No  Recommended Plan for Multiple Antipsychotic Therapies: NA   Patient states he is leaving in good spirits. Plans to go to [facility] as above- mother will transport him there.    COBOS, FERNANDO 05/02/2015, 11:57 AM

## 2015-05-02 NOTE — Progress Notes (Signed)
Discharge Note:  Patient discharged home with mother.  Denied SI and HI.  Denied A/V hallucinations.  Denied pain.  Suicide prevention information given and discussed with patient who stated he understood and had no questions.  Patient stated he received all his belongings, clothing, misc items, toiletries, prescriptions, medications, etc.  Patient stated he appreciated all assistance received from [facility] staff.

## 2015-05-02 NOTE — Progress Notes (Signed)
D:  Patient's self inventory sheet, patient slept good last night, sleep medication is helpful.  Good appetite, high energy level, good concentration.  Rated depression 3, hopeless 1, anxiety 4.  Withdrawals of shakes.  Denied SI.  Physical problems, back pain, worst pain #7.  Goal is to "be happy".  Plans to be positive.  Does have discharge plans. A:  Medications administered per MD orders.  Emotional support and encouragement given patient. R:  Denied SI and HI, contracts for safety.  Denied A/V hallucinations.  Safety maintained with 15 minute checks.

## 2015-05-02 NOTE — [facility] Group Notes (Signed)
[facility] LCSW Group Therapy 05/02/2015  1:15 pm  Type of Therapy: Group Therapy Participation Level: Active  Participation Quality: Attentive, Sharing and Supportive  Affect: Appropriate  Cognitive: Alert and Oriented  Insight: Developing/Improving and Engaged  Engagement in Therapy: Developing/Improving and Engaged  Modes of Intervention: Clarification, Confrontation, Discussion, Education, Exploration,  Limit-setting, Orientation, Problem-solving, Rapport Building, Dance movement psychotherapist, Socialization and Support  Summary of Progress/Problems: Pt identified obstacles faced currently and processed barriers involved in overcoming these obstacles. Pt identified steps necessary for overcoming these obstacles and explored motivation (internal and external) for facing these difficulties head on. Pt further identified one area of concern in their lives and chose a goal to focus on for today. Patient identified difficulty and resistance to asking others for help as an obstacle but reports that he is proud of himself for getting into treatment. CSW and other group members provided patient with emotional support and encouragement. Patient left group early to discharge home.  Samuella Bruin, MSW, [employer] Clinical Social Worker [facility] [phone]

## 2015-05-02 NOTE — Discharge Summary (Signed)
Physician Discharge Summary Note  Patient:  Jay Nelson is an 34 y.o., male MRN:  [id number] DOB:  06-23-1981 Patient phone:  [phone] (home)  Patient address:   [address] [place] [address] [place] [address],  Total Time spent with patient: Greater than 30 minutes  Date of Admission:  04/29/2015  Date of Discharge: 05-02-15  Reason for Admission:  Alcohol detoxification treatment  Principal Problem: Alcohol use disorder, severe, dependence Discharge Diagnoses: Patient Active Problem List   Diagnosis Date Noted  . Major depressive disorder, single episode, severe without psychotic features [F32.2]   . Major depressive disorder, single episode, severe [F32.2] 04/29/2015  . Alcohol use disorder, severe, dependence [F10.20] 04/29/2015  . Cannabis abuse [F12.10] 04/29/2015  . GAD (generalized anxiety disorder) [F41.1] 04/29/2015  . Social anxiety disorder [F40.10] 04/29/2015  . Benzodiazepine dependence, continuous [F13.20] 04/29/2015   Musculoskeletal: Strength & Muscle Tone: within normal limits Gait & Station: normal Patient leans: N/A  Psychiatric Specialty Exam: Physical Exam  Psychiatric: His speech is normal and behavior is normal. Judgment and thought content normal. His mood appears not anxious. His affect is not angry, not blunt, not labile and not inappropriate. Cognition and memory are normal. He does not exhibit a depressed mood.    Review of Systems  Constitutional: Negative.   HENT: Negative.   Eyes: Negative.   Respiratory: Negative.   Cardiovascular: Negative.   Gastrointestinal: Negative.   Genitourinary: Negative.   Musculoskeletal: Negative.   Skin: Negative.   Neurological: Negative.   Endo/Heme/Allergies: Negative.   Psychiatric/Behavioral: Positive for substance abuse (Alcoholism, chronic). Negative for depression, suicidal ideas, hallucinations and memory loss. The patient is not nervous/anxious and does not have insomnia.     Blood pressure  128/82, pulse 64, temperature 97.5 F (36.4 C), temperature source Oral, resp. rate 16, height 6' (1.829 m), weight 58.514 kg (129 lb).Body mass index is 17.49 kg/(m^2).  See Md's SRA   Have you used any form of tobacco in the last 30 days? (Cigarettes, Smokeless Tobacco, Cigars, and/or Pipes): Yes  Has this patient used any form of tobacco in the last 30 days? (Cigarettes, Smokeless Tobacco, Cigars, and/or Pipes): Yes, nicotine provided.  Past Medical History:  Past Medical History  Diagnosis Date  . Anxiety   . Asthma   . Chronic back pain    History reviewed. No pertinent past surgical history.  Family History: History reviewed. No pertinent family history.  Social History:  History  Alcohol Use  . Yes     History  Drug Use  . Yes  . Special: Marijuana    History   Social History  . Marital Status: Single    Spouse Name: N/A  . Number of Children: N/A  . Years of Education: N/A   Social History Main Topics  . Smoking status: Current Every Day Smoker  . Smokeless tobacco: Not on file  . Alcohol Use: Yes  . Drug Use: Yes    Special: Marijuana  . Sexual Activity: Not on file   Other Topics Concern  . None   Social History Narrative   Risk to Self: Is patient at risk for suicide?: No What has been your use of drugs/alcohol within the last 12 months?: Daily THC use, ETOH binges for several days at a time Risk to Others: No Prior Inpatient Therapy: Yes Prior Outpatient Therapy: Yes  Level of Care:  OP  Hospital Course:  34 Y/o male who states he has a problem with alcohol, drinks as much  as he can. ( last episode14 beers in 4 and 1/2 hrs at 3:30 in the afternoon yesterday) has bee smoking pot since he was 15. States he has been dealing with anxiety all of his life not comfortable on his own skin. States the anxiety builds up to a full panic attack. "Can't breath good, chest feels tight". He takes Xanax up to 1 mg QID. Has been on a "drunk for 7 days" so he has not  been back to work so he does not know if he has a job or not. He also admits to depression on going  Hewitt was admitted to the unit with a blood alcohol level of 48 per toxicology test reports & UDS test results positive for benzodiazepine & THC. He was intoxicated requiring detoxification treatments. He was then medicated with Librium detox protocols for alcohol detox. Parsa was also seeking further substance abuse treatment after discharge. He was enrolled & participated in the group counseling sessions being offered & held on this unit. He learned coping skills.   Besides the detoxification treatments, Eduardo was medicated and discharged on Neurontin 300 mg for substance withdrawal symptoms/agitation, multivitamin & Nicotine patch for nicotine addiction. Dequarius completed detox treatment & his mood is stable. He is currently being discharged to continue treatment as noted below. He is provided with all the necessary information needed to make this appointment without problems. Upon discharge, he adamantly denies any SIHI, AVH, delusional thoughts, paranoia & or substance withdrawal syndrome. Shawndell was provided with a 14 days worth, supply samples of his [facility] discharge medications. He left [facility] with all belongings in no distress. Transportation per patient arranged.  Consults:  psychiatry  Significant Diagnostic Studies:  labs: CBC with diff, CMP, UDS< toxicology tests, U/A, reports reviewed, stable  Discharge Vitals:   Blood pressure 128/82, pulse 64, temperature 97.5 F (36.4 C), temperature source Oral, resp. rate 16, height 6' (1.829 m), weight 58.514 kg (129 lb). Body mass index is 17.49 kg/(m^2). Lab Results:   No results found for this or any previous visit (from the past 72 hour(s)).  Physical Findings: AIMS: Facial and Oral Movements Muscles of Facial Expression: None, normal Lips and Perioral Area: None, normal Jaw: None, normal Tongue: None, normal,Extremity Movements Upper (arms, wrists,  hands, fingers): None, normal Lower (legs, knees, ankles, toes): None, normal, Trunk Movements Neck, shoulders, hips: None, normal, Overall Severity Severity of abnormal movements (highest score from questions above): None, normal Incapacitation due to abnormal movements: None, normal Patient's awareness of abnormal movements (rate only patient's report): No Awareness, Dental Status Current problems with teeth and/or dentures?: No Does patient usually wear dentures?: No  CIWA:  CIWA-Ar Total: 2 COWS:  COWS Total Score: 1  See Psychiatric Specialty Exam and Suicide Risk Assessment completed by Attending Physician prior to discharge.  Discharge destination:  Home  Is patient on multiple antipsychotic therapies at discharge:  No   Has Patient had three or more failed trials of antipsychotic monotherapy by history:  No  Recommended Plan for Multiple Antipsychotic Therapies: NA    Medication List    STOP taking these medications        ALPRAZolam 1 MG tablet  Commonly known as:  XANAX     oxyCODONE-acetaminophen 10-325 MG per tablet  Commonly known as:  PERCOCET      TAKE these medications      Indication   gabapentin 300 MG capsule  Commonly known as:  NEURONTIN  Take 1 capsul three times daily &  at bedtime: For agitation/substance withdrawal syndrome   Indication:  Agitation, Substance withdrawal syndrome     multivitamin with minerals Tabs tablet  Take 1 tablet by mouth daily. For low vitamin   Indication:  Low vitamin     nicotine 21 mg/24hr patch  Commonly known as:  NICODERM CQ - dosed in mg/24 hours  Place 1 patch (21 mg total) onto the skin daily. For nicotine addiction   Indication:  Nicotine Addiction       Follow-up Information    Follow up with [facility]. On 05/02/2015.   Why:  Please arrive at [facility] on Monday June 20th by 5:30 pm for continued treatment. Please call if you need to reschedule.   Contact information:   [address] [place] [phone]      Follow-up recommendations: Activity:  As tolerated Diet: As recommended by your primary care doctor. Keep all scheduled follow-up appointments as recommended.    Comments: Take all your medications as prescribed by your mental healthcare provider. Report any adverse effects and or reactions from your medicines to your outpatient provider promptly. Patient is instructed and cautioned to not engage in alcohol and or illegal drug use while on prescription medicines. In the event of worsening symptoms, patient is instructed to call the crisis hotline, 911 and or go to the nearest ED for appropriate evaluation and treatment of symptoms. Follow-up with your primary care provider for your other medical issues, concerns and or health care needs.   Total Discharge Time: Greater than 30 minutes  Signed: Sanjuana Kava, PMHNP, FNP-BC 05/03/2015, 3:48 PM   Patient seen, Suicide Assessment Completed.  Disposition Plan Reviewed

## 2015-05-02 NOTE — Progress Notes (Signed)
Recreation Therapy Notes  Date: 06.20.16 Time: 9:30 am Location: [address] Group Room  Group Topic: Stress Management  Goal Area(s) Addresses:  Patient will verbalize importance of using healthy stress management.  Patient will identify positive emotions associated with healthy stress management.   Intervention: Stress Management  Activity :  Guided Imagery.  LRT introduced and educated patients on stress management technique of guided imagery.  A script was used to deliver the technique to patients.  Patients were asked to follow script read a loud by LRT to engage in practicing the stress management technique.  Education:  Stress Management, Discharge Planning.   Clinical Observations/Feedback: Patient did not attend group.   Crescentia Boutwell, LRT/CTRS         Shifa Brisbon A 05/02/2015 4:17 PM 

## 2018-10-08 DIAGNOSIS — Z0001 Encounter for general adult medical examination with abnormal findings: Secondary | ICD-10-CM | POA: Diagnosis not present

## 2018-10-08 DIAGNOSIS — Z1389 Encounter for screening for other disorder: Secondary | ICD-10-CM | POA: Diagnosis not present

## 2018-10-08 DIAGNOSIS — J019 Acute sinusitis, unspecified: Secondary | ICD-10-CM | POA: Diagnosis not present

## 2018-10-08 DIAGNOSIS — H6993 Unspecified Eustachian tube disorder, bilateral: Secondary | ICD-10-CM | POA: Diagnosis not present

## 2018-10-08 DIAGNOSIS — Z6821 Body mass index (BMI) 21.0-21.9, adult: Secondary | ICD-10-CM | POA: Diagnosis not present

## 2019-12-10 ENCOUNTER — Other Ambulatory Visit: Payer: Self-pay

## 2019-12-10 ENCOUNTER — Ambulatory Visit: Payer: Self-pay | Attending: Internal Medicine

## 2019-12-10 DIAGNOSIS — Z20822 Contact with and (suspected) exposure to covid-19: Secondary | ICD-10-CM

## 2019-12-10 DIAGNOSIS — U071 COVID-19: Secondary | ICD-10-CM | POA: Insufficient documentation

## 2019-12-11 LAB — NOVEL CORONAVIRUS, NAA: SARS-CoV-2, NAA: DETECTED — AB

## 2019-12-21 ENCOUNTER — Ambulatory Visit: Payer: Self-pay | Attending: Internal Medicine

## 2019-12-21 ENCOUNTER — Other Ambulatory Visit: Payer: Self-pay

## 2019-12-21 DIAGNOSIS — Z20822 Contact with and (suspected) exposure to covid-19: Secondary | ICD-10-CM | POA: Insufficient documentation

## 2019-12-22 LAB — NOVEL CORONAVIRUS, NAA: SARS-CoV-2, NAA: NOT DETECTED

## 2023-06-12 ENCOUNTER — Encounter: Payer: Self-pay | Admitting: Family Medicine

## 2023-06-12 ENCOUNTER — Ambulatory Visit: Payer: Medicaid Other | Admitting: Family Medicine

## 2023-06-12 VITALS — BP 128/82 | HR 64 | Temp 98.0°F | Ht 71.0 in | Wt 132.0 lb

## 2023-06-12 DIAGNOSIS — Z1329 Encounter for screening for other suspected endocrine disorder: Secondary | ICD-10-CM

## 2023-06-12 DIAGNOSIS — Z87898 Personal history of other specified conditions: Secondary | ICD-10-CM | POA: Diagnosis not present

## 2023-06-12 DIAGNOSIS — Z0000 Encounter for general adult medical examination without abnormal findings: Secondary | ICD-10-CM | POA: Diagnosis not present

## 2023-06-12 DIAGNOSIS — Z23 Encounter for immunization: Secondary | ICD-10-CM | POA: Diagnosis not present

## 2023-06-12 DIAGNOSIS — Z114 Encounter for screening for human immunodeficiency virus [HIV]: Secondary | ICD-10-CM

## 2023-06-12 DIAGNOSIS — Z1159 Encounter for screening for other viral diseases: Secondary | ICD-10-CM

## 2023-06-12 DIAGNOSIS — F17200 Nicotine dependence, unspecified, uncomplicated: Secondary | ICD-10-CM

## 2023-06-12 NOTE — Progress Notes (Signed)
New Patient Office Visit  Subjective    Patient ID: Jay Nelson, male    DOB: 28-Dec-1980  Age: 42 y.o. MRN: [id number]  CC:  Chief Complaint  Patient presents with   Establish Care    Referral neurology     HPI Jay Nelson presents to establish care. Oriented to practice routines and expectations. 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 He has not had any seizure activity since and is taking Keppra 500mg  BID. He does report he was having headaches once a week for several months described as bilateral frontal pounding. Denies vision changes, nausea, vomiting, sensitivity to light or sound, lightheadedness, dizziness. Relieved by Excedrin, he is able to work through them. No other medical history or current concerns.   Tobacco: vapes STI: declines Vaccines:  Tdap today    Outpatient Encounter Medications as of 06/12/2023  Medication Sig   levETIRAcetam (KEPPRA) 500 MG tablet Take 500 mg by mouth 2 (two) times daily.   [DISCONTINUED] gabapentin (NEURONTIN) 300 MG capsule Take 1 capsul three times daily & at bedtime: For agitation/substance withdrawal syndrome   [DISCONTINUED] Multiple Vitamin (MULTIVITAMIN WITH MINERALS) TABS tablet Take 1 tablet by mouth daily. For low vitamin   [DISCONTINUED] nicotine (NICODERM CQ - DOSED IN MG/24 HOURS) 21 mg/24hr patch Place 1 patch (21 mg total) onto the skin daily. For nicotine addiction   No facility-administered encounter medications on file as of 06/12/2023.    Past Medical History:  Diagnosis Date   Anxiety    Asthma    Chronic back pain     History reviewed. No pertinent surgical history.  History reviewed. No pertinent family history.  Social History   Socioeconomic History   Marital status: Single    Spouse name: Not on file   Number of children: Not on file   Years of education: Not on file    Highest education level: Not on file  Occupational History   Not on file  Tobacco Use   Smoking status: Every Day   Smokeless tobacco: Not on file  Vaping Use   Vaping status: Every Day  Substance and Sexual Activity   Alcohol use: Yes   Drug use: Yes    Types: Marijuana   Sexual activity: Not on file  Other Topics Concern   Not on file  Social History Narrative   Not on file   Social Determinants of Health   Financial Resource Strain: Low Risk  (05/30/2023)   Received from [facility]   Overall Financial Resource Strain (CARDIA)    Difficulty of Paying Living Expenses: Not very hard  Food Insecurity: No Food Insecurity (05/30/2023)   Received from [facility]   Hunger Vital Sign    Worried About Running Out of Food in the Last Year: Never true    Ran Out of Food in the Last Year: Never true  Transportation Needs: No Transportation Needs (06/01/2023)   Received from [facility] - Transportation    Lack of Transportation (Medical): No    Lack of Transportation (Non-Medical): No  Physical Activity: Not on file  Stress: Not on file  Social Connections: Not on file  Intimate Partner Violence: Not on file    Review of Systems  Constitutional:  Positive for malaise/fatigue.  Eyes: Negative.   Respiratory: Negative.    Cardiovascular: Negative.   Gastrointestinal: Negative.  Genitourinary: Negative.   Musculoskeletal: Negative.   Skin: Negative.   Neurological:  Positive for seizures and headaches.  Endo/Heme/Allergies: Negative.   Psychiatric/Behavioral: Negative.    All other systems reviewed and are negative.       Objective    BP 128/82 (BP Location: Left Arm, Patient Position: Sitting, Cuff Size: Normal)   Pulse 64   Temp 98 F (36.7 C) (Oral)   Ht 5\' 11"  (1.803 m)   Wt 132 lb (59.9 kg)   SpO2 97%   BMI 18.41 kg/m   Physical Exam Vitals and nursing note reviewed.  Constitutional:      Appearance: Normal appearance. He is  underweight.  HENT:     Head: Normocephalic and atraumatic.     Right Ear: Tympanic membrane, ear canal and external ear normal.     Left Ear: Tympanic membrane, ear canal and external ear normal.     Nose: Nose normal.     Mouth/Throat:     Mouth: Mucous membranes are moist.     Pharynx: Oropharynx is clear.  Eyes:     Extraocular Movements: Extraocular movements intact.     Right eye: Normal extraocular motion and no nystagmus.     Left eye: Normal extraocular motion and no nystagmus.     Conjunctiva/sclera: Conjunctivae normal.     Pupils: Pupils are equal, round, and reactive to light.  Cardiovascular:     Rate and Rhythm: Normal rate and regular rhythm.     Pulses: Normal pulses.     Heart sounds: Normal heart sounds.  Pulmonary:     Effort: Pulmonary effort is normal.     Breath sounds: Normal breath sounds.  Abdominal:     General: Bowel sounds are normal.     Palpations: Abdomen is soft.  Genitourinary:    Comments: Deferred using shared decision making Musculoskeletal:        General: Normal range of motion.     Cervical back: Normal range of motion and neck supple.  Skin:    General: Skin is warm and dry.     Capillary Refill: Capillary refill takes less than 2 seconds.  Neurological:     General: No focal deficit present.     Mental Status: He is alert. Mental status is at baseline.  Psychiatric:        Mood and Affect: Mood normal.        Speech: Speech normal.        Behavior: Behavior normal.        Thought Content: Thought content normal.        Cognition and Memory: Cognition and memory normal.        Judgment: Judgment normal.         Assessment & Plan:   Problem List Items Addressed This Visit     History of seizure    Evaluated in ED 05/29/2023. "CT scan of the head showed that small right subgaleal hematoma and also showed certain findings which could suggest right frontotemporal lobe cortical dysplasia. Since hospitalization there is no  recurrence of seizures MRI brain shows similar findings of right frontal temporal cortical dysplasia. EEGs within normal limits. ". No symptoms since ED visit. Continue Keppra 500mg  BID. Neurology referral placed.      Relevant Orders   Ambulatory referral to Neurology   Physical exam, annual - Primary    Today your medical history was reviewed and routine physical exam with labs was performed. Recommend 150 minutes of moderate intensity  exercise weekly and consuming a well-balanced diet. Advised to stop smoking if a smoker, avoid smoking if a non-smoker, limit alcohol consumption to 1 drink per day for women and 2 drinks per day for men, and avoid illicit drug use. Counseled on safe sex practices and offered STI testing today. Counseled on the importance of sunscreen use. Counseled in mental health awareness and when to seek medical care. Vaccine maintenance discussed. Appropriate health maintenance items reviewed. Return to office in 1 year for annual physical exam.       Relevant Orders   CBC with Differential/Platelet   COMPLETE METABOLIC PANEL WITH GFR   Lipid panel   TSH   RESOLVED: Nicotine dependence due to vaping non-tobacco product   Other Visit Diagnoses     Screening for HIV (human immunodeficiency virus)       Relevant Orders   HIV Antibody (routine testing w rflx)   Need for hepatitis C screening test       Relevant Orders   Hepatitis C antibody   Screening for thyroid disorder       Relevant Orders   TSH       Return in about 1 year (around 06/11/2024) for annual physical with labs 1 week prior.   Park Meo, FNP

## 2023-06-12 NOTE — Assessment & Plan Note (Signed)

## 2023-06-12 NOTE — Patient Instructions (Signed)
It was great to meet you today and I'm excited to have you join the [facility] practice. I hope you had a positive experience today! If you feel so inclined, please feel free to recommend our practice to friends and family. Mila Merry, FNP-C

## 2023-06-12 NOTE — Assessment & Plan Note (Signed)
Evaluated in ED 05/29/2023. "CT scan of the head showed that small right subgaleal hematoma and also showed certain findings which could suggest right frontotemporal lobe cortical dysplasia. 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 EEGs within normal limits. ". No symptoms since ED visit. Continue Keppra 500mg  BID. Neurology referral placed.

## 2023-06-13 ENCOUNTER — Other Ambulatory Visit: Payer: Medicaid Other

## 2023-06-13 ENCOUNTER — Other Ambulatory Visit (INDEPENDENT_AMBULATORY_CARE_PROVIDER_SITE_OTHER): Payer: Medicaid Other

## 2023-06-13 DIAGNOSIS — Z23 Encounter for immunization: Secondary | ICD-10-CM

## 2023-08-01 ENCOUNTER — Encounter: Payer: Self-pay | Admitting: Neurology

## 2023-08-01 ENCOUNTER — Ambulatory Visit (INDEPENDENT_AMBULATORY_CARE_PROVIDER_SITE_OTHER): Payer: Medicaid Other | Admitting: Neurology

## 2023-08-01 VITALS — BP 117/74 | HR 56 | Ht 71.0 in | Wt 132.0 lb

## 2023-08-01 DIAGNOSIS — G40909 Epilepsy, unspecified, not intractable, without status epilepticus: Secondary | ICD-10-CM

## 2023-08-01 MED ORDER — DIVALPROEX SODIUM ER 500 MG PO TB24: 1000.0000 mg | ORAL_TABLET | Freq: Every evening | ORAL | 11 refills | Status: DC
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
## 2023-08-26 ENCOUNTER — Other Ambulatory Visit: Payer: Medicaid Other | Admitting: *Deleted

## 2023-08-26 ENCOUNTER — Ambulatory Visit: Payer: Medicaid Other | Admitting: Neurology

## 2023-08-26 DIAGNOSIS — G40909 Epilepsy, unspecified, not intractable, without status epilepticus: Secondary | ICD-10-CM | POA: Diagnosis not present

## 2023-09-21 NOTE — Procedures (Signed)
   HISTORY: Patient had a history of seizure  TECHNIQUE:  This is a routine 16 channel EEG recording with one channel devoted to a limited EKG recording.  It was performed during wakefulness, drowsiness and asleep.  Hyperventilation and photic stimulation were performed as activating procedures.  There are minimum muscle and movement artifact noted.  Upon maximum arousal, posterior dominant waking rhythm consistent of rhythmic alpha range activity. Activities are symmetric over the bilateral posterior derivations and attenuated with eye opening.  Photic stimulation did not alter the tracing.  Hyperventilation produced mild/moderate buildup with higher amplitude and the slower activities noted.  During EEG recording, patient developed drowsiness and no deeper stage of sleep was achieved  During EEG recording, there was no epileptiform discharge noted.  EKG demonstrate normal sinus rhythm.  CONCLUSION: This is a  normal awake EEG.  There is no electrodiagnostic evidence of epileptiform discharge.  Levert Feinstein, M.D. Ph.D.  [facility] [address] [place], [place] [address] Phone: [phone] Fax:      [phone]
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
## 2024-02-25 ENCOUNTER — Ambulatory Visit: Payer: Medicaid Other | Admitting: Adult Health

## 2024-02-25 ENCOUNTER — Encounter: Payer: Self-pay | Admitting: Adult Health

## 2024-02-25 VITALS — BP 105/60 | HR 60 | Ht 71.0 in | Wt 141.0 lb

## 2024-02-25 DIAGNOSIS — G40909 Epilepsy, unspecified, not intractable, without status epilepticus: Secondary | ICD-10-CM

## 2024-02-25 DIAGNOSIS — Z5181 Encounter for therapeutic drug level monitoring: Secondary | ICD-10-CM

## 2024-02-25 MED ORDER — DIVALPROEX SODIUM ER 500 MG PO TB24: 1000.0000 mg | ORAL_TABLET | Freq: Every evening | ORAL | 3 refills | Status: DC

## 2024-02-25 NOTE — Patient Instructions (Addendum)
 Your Plan:  Continue Depakote ER 1000mg  nightly - refill provided  We will check lab work today - you will see the results on your MyChart tomorrow but we will call you with any significant abnormal findings  Please call with any seizure activity     Follow up in 1 year or call earlier if needed      Thank you for coming to see us  at [facility]. I hope we have been able to provide you high quality care today.  You may receive a patient satisfaction survey over the next few weeks. We would appreciate your feedback and comments so that we may continue to improve ourselves and the health of our patients.

## 2024-02-26 ENCOUNTER — Encounter: Payer: Self-pay | Admitting: Adult Health

## 2024-02-26 ENCOUNTER — Other Ambulatory Visit: Payer: Self-pay | Admitting: Adult Health

## 2024-02-26 DIAGNOSIS — R7989 Other specified abnormal findings of blood chemistry: Secondary | ICD-10-CM

## 2024-02-26 LAB — CBC WITH DIFFERENTIAL/PLATELET
Basophils Absolute: 0 10*3/uL (ref 0.0–0.2)
Basos: 1 %
EOS (ABSOLUTE): 0.2 10*3/uL (ref 0.0–0.4)
Eos: 4 %
Hematocrit: 40.5 % (ref 37.5–51.0)
Hemoglobin: 13.5 g/dL (ref 13.0–17.7)
Immature Grans (Abs): 0 10*3/uL (ref 0.0–0.1)
Immature Granulocytes: 0 %
Lymphocytes Absolute: 1.6 10*3/uL (ref 0.7–3.1)
Lymphs: 32 %
MCH: 30.5 pg (ref 26.6–33.0)
MCHC: 33.3 g/dL (ref 31.5–35.7)
MCV: 91 fL (ref 79–97)
Monocytes Absolute: 0.5 10*3/uL (ref 0.1–0.9)
Monocytes: 11 %
Neutrophils Absolute: 2.6 10*3/uL (ref 1.4–7.0)
Neutrophils: 52 %
Platelets: 260 10*3/uL (ref 150–450)
RBC: 4.43 x10E6/uL (ref 4.14–5.80)
RDW: 13 % (ref 11.6–15.4)
WBC: 5 10*3/uL (ref 3.4–10.8)

## 2024-02-26 LAB — HEPATIC FUNCTION PANEL
ALT: 34 IU/L (ref 0–44)
AST: 49 IU/L — ABNORMAL HIGH (ref 0–40)
Albumin: 4.5 g/dL (ref 4.1–5.1)
Alkaline Phosphatase: 61 IU/L (ref 44–121)
Bilirubin Total: 0.4 mg/dL (ref 0.0–1.2)
Bilirubin, Direct: 0.14 mg/dL (ref 0.00–0.40)
Total Protein: 6.1 g/dL (ref 6.0–8.5)

## 2024-02-26 LAB — VALPROIC ACID LEVEL: Valproic Acid Lvl: 55 ug/mL (ref 50–100)

## 2024-07-30 ENCOUNTER — Telehealth: Payer: Self-pay | Admitting: Adult Health

## 2024-07-30 MED ORDER — DIVALPROEX SODIUM ER 500 MG PO TB24: 1000.0000 mg | ORAL_TABLET | Freq: Every evening | ORAL | 3 refills | Status: AC

## 2024-07-30 NOTE — Telephone Encounter (Signed)
 refilled

## 2024-07-30 NOTE — Telephone Encounter (Signed)
 Pt called needing a refill request sent on his divalproex  (DEPAKOTE  ER) 500 MG 24 hr tablet as soon as possible to the [facility] on [address]

## 2024-07-31 ENCOUNTER — Other Ambulatory Visit: Payer: Self-pay | Admitting: Neurology
